# Patient Record
Sex: Female | Born: 1950 | Race: White | Hispanic: No | Marital: Married | Smoking: Never smoker
Health system: Southern US, Community
[De-identification: ages and names within clinical notes are randomized; demographics above are authoritative.]

## PROBLEM LIST (undated history)

## (undated) DIAGNOSIS — N189 Chronic kidney disease, unspecified: Secondary | ICD-10-CM

## (undated) DIAGNOSIS — I509 Heart failure, unspecified: Secondary | ICD-10-CM

## (undated) DIAGNOSIS — I2510 Atherosclerotic heart disease of native coronary artery without angina pectoris: Secondary | ICD-10-CM

## (undated) DIAGNOSIS — I10 Essential (primary) hypertension: Secondary | ICD-10-CM

## (undated) DIAGNOSIS — J449 Chronic obstructive pulmonary disease, unspecified: Secondary | ICD-10-CM

## (undated) HISTORY — PX: CARDIAC DEFIBRILLATOR PLACEMENT: SHX171

---

## 2009-05-25 HISTORY — PX: CORONARY ARTERY BYPASS GRAFT: SHX141

## 2010-02-02 ENCOUNTER — Inpatient Hospital Stay (HOSPITAL_COMMUNITY): Admission: EM | Admit: 2010-02-02 | Discharge: 2010-02-12 | Payer: Self-pay | Admitting: Cardiology

## 2010-02-02 ENCOUNTER — Encounter: Payer: Self-pay | Admitting: Emergency Medicine

## 2010-02-02 ENCOUNTER — Encounter: Payer: Self-pay | Admitting: Cardiology

## 2010-02-02 ENCOUNTER — Ambulatory Visit: Payer: Self-pay | Admitting: Diagnostic Radiology

## 2010-02-02 ENCOUNTER — Ambulatory Visit: Payer: Self-pay | Admitting: Cardiology

## 2010-02-03 ENCOUNTER — Encounter: Payer: Self-pay | Admitting: Cardiology

## 2010-02-03 ENCOUNTER — Encounter: Payer: Self-pay | Admitting: Cardiovascular Disease

## 2010-02-03 ENCOUNTER — Ambulatory Visit: Payer: Self-pay | Admitting: Cardiothoracic Surgery

## 2010-02-06 ENCOUNTER — Encounter: Payer: Self-pay | Admitting: Thoracic Surgery (Cardiothoracic Vascular Surgery)

## 2010-02-12 ENCOUNTER — Encounter: Payer: Self-pay | Admitting: Cardiology

## 2010-02-26 DIAGNOSIS — I2510 Atherosclerotic heart disease of native coronary artery without angina pectoris: Secondary | ICD-10-CM

## 2010-02-26 DIAGNOSIS — J438 Other emphysema: Secondary | ICD-10-CM

## 2010-02-26 DIAGNOSIS — E7800 Pure hypercholesterolemia, unspecified: Secondary | ICD-10-CM

## 2010-02-26 DIAGNOSIS — K219 Gastro-esophageal reflux disease without esophagitis: Secondary | ICD-10-CM | POA: Insufficient documentation

## 2010-02-26 DIAGNOSIS — I10 Essential (primary) hypertension: Secondary | ICD-10-CM

## 2010-02-26 DIAGNOSIS — F17200 Nicotine dependence, unspecified, uncomplicated: Secondary | ICD-10-CM

## 2010-02-26 DIAGNOSIS — J449 Chronic obstructive pulmonary disease, unspecified: Secondary | ICD-10-CM

## 2010-02-28 ENCOUNTER — Encounter: Payer: Self-pay | Admitting: Cardiology

## 2010-06-24 NOTE — Letter (Signed)
Summary: [place] [facility] Addendum  [facility] Addendum   Imported By: Roderic Ovens 03/14/2010 08:56:46  _____________________________________________________________________  External Attachment:    Type:   Image     Comment:   External Document

## 2010-06-24 NOTE — Consult Note (Signed)
Summary: [facility] Phase II Referral  [facility] Phase II Referral   Imported By: Roderic Ovens 04/04/2010 10:57:52  _____________________________________________________________________  External Attachment:    Type:   Image     Comment:   External Document

## 2010-06-24 NOTE — Consult Note (Signed)
Summary: [facility] Rehab Referral Form  [facility] Rehab Referral Form   Imported By: Roderic Ovens 03/12/2010 15:34:12  _____________________________________________________________________  External Attachment:    Type:   Image     Comment:   External Document

## 2010-06-24 NOTE — Letter (Signed)
Summary: [facility]  [facility]   Imported By: Marylou Mccoy 02/25/2010 08:26:20  _____________________________________________________________________  External Attachment:    Type:   Image     Comment:   External Document

## 2010-08-07 LAB — URINALYSIS, ROUTINE W REFLEX MICROSCOPIC
Bilirubin Urine: NEGATIVE
Glucose, UA: NEGATIVE mg/dL
Hgb urine dipstick: NEGATIVE
Ketones, ur: NEGATIVE mg/dL
Nitrite: NEGATIVE
Protein, ur: NEGATIVE mg/dL
Specific Gravity, Urine: 1.023 (ref 1.005–1.030)
Urobilinogen, UA: 0.2 mg/dL (ref 0.0–1.0)
pH: 5.5 (ref 5.0–8.0)

## 2010-08-07 LAB — BASIC METABOLIC PANEL
BUN: 10 mg/dL (ref 6–23)
BUN: 10 mg/dL (ref 6–23)
BUN: 12 mg/dL (ref 6–23)
BUN: 17 mg/dL (ref 6–23)
BUN: 29 mg/dL — ABNORMAL HIGH (ref 6–23)
BUN: 31 mg/dL — ABNORMAL HIGH (ref 6–23)
BUN: 35 mg/dL — ABNORMAL HIGH (ref 6–23)
BUN: 36 mg/dL — ABNORMAL HIGH (ref 6–23)
BUN: 15 mg/dL (ref 6–23)
CO2: 23 mEq/L (ref 19–32)
CO2: 26 mEq/L (ref 19–32)
CO2: 29 mEq/L (ref 19–32)
CO2: 30 mEq/L (ref 19–32)
CO2: 31 mEq/L (ref 19–32)
CO2: 32 mEq/L (ref 19–32)
CO2: 33 mEq/L — ABNORMAL HIGH (ref 19–32)
CO2: 26 mEq/L (ref 19–32)
Calcium: 8.6 mg/dL (ref 8.4–10.5)
Calcium: 8.7 mg/dL (ref 8.4–10.5)
Calcium: 8.7 mg/dL (ref 8.4–10.5)
Calcium: 8.7 mg/dL (ref 8.4–10.5)
Calcium: 8.8 mg/dL (ref 8.4–10.5)
Calcium: 8.9 mg/dL (ref 8.4–10.5)
Calcium: 9 mg/dL (ref 8.4–10.5)
Calcium: 8.7 mg/dL (ref 8.4–10.5)
Calcium: 9.2 mg/dL (ref 8.4–10.5)
Chloride: 108 mEq/L (ref 96–112)
Chloride: 110 mEq/L (ref 96–112)
Chloride: 96 mEq/L (ref 96–112)
Chloride: 96 mEq/L (ref 96–112)
Chloride: 97 mEq/L (ref 96–112)
Chloride: 97 mEq/L (ref 96–112)
Chloride: 98 mEq/L (ref 96–112)
Chloride: 100 mEq/L (ref 96–112)
Chloride: 102 mEq/L (ref 96–112)
Creatinine, Ser: 0.84 mg/dL (ref 0.4–1.2)
Creatinine, Ser: 0.88 mg/dL (ref 0.4–1.2)
Creatinine, Ser: 0.95 mg/dL (ref 0.4–1.2)
Creatinine, Ser: 1.25 mg/dL — ABNORMAL HIGH (ref 0.4–1.2)
Creatinine, Ser: 1.25 mg/dL — ABNORMAL HIGH (ref 0.4–1.2)
Creatinine, Ser: 1.28 mg/dL — ABNORMAL HIGH (ref 0.4–1.2)
Creatinine, Ser: 1.3 mg/dL — ABNORMAL HIGH (ref 0.4–1.2)
Creatinine, Ser: 0.94 mg/dL (ref 0.4–1.2)
GFR calc Af Amer: 51 mL/min — ABNORMAL LOW (ref >60)
GFR calc Af Amer: 53 mL/min — ABNORMAL LOW (ref >60)
GFR calc Af Amer: 53 mL/min — ABNORMAL LOW (ref >60)
GFR calc Af Amer: >60 mL/min (ref >60)
GFR calc Af Amer: >60 mL/min (ref >60)
GFR calc Af Amer: >60 mL/min (ref >60)
GFR calc Af Amer: >60 mL/min (ref >60)
GFR calc Af Amer: >60 mL/min (ref >60)
GFR calc Af Amer: >60 mL/min (ref >60)
GFR calc non Af Amer: 42 mL/min — ABNORMAL LOW (ref >60)
GFR calc non Af Amer: 43 mL/min — ABNORMAL LOW (ref >60)
GFR calc non Af Amer: 44 mL/min — ABNORMAL LOW (ref >60)
GFR calc non Af Amer: 44 mL/min — ABNORMAL LOW (ref >60)
GFR calc non Af Amer: 56 mL/min — ABNORMAL LOW (ref >60)
GFR calc non Af Amer: >60 mL/min (ref >60)
GFR calc non Af Amer: >60 mL/min (ref >60)
GFR calc non Af Amer: >60 mL/min (ref >60)
GFR calc non Af Amer: >60 mL/min (ref >60)
GFR calc non Af Amer: >60 mL/min (ref >60)
Glucose, Bld: 100 mg/dL — ABNORMAL HIGH (ref 70–99)
Glucose, Bld: 103 mg/dL — ABNORMAL HIGH (ref 70–99)
Glucose, Bld: 111 mg/dL — ABNORMAL HIGH (ref 70–99)
Glucose, Bld: 118 mg/dL — ABNORMAL HIGH (ref 70–99)
Glucose, Bld: 133 mg/dL — ABNORMAL HIGH (ref 70–99)
Glucose, Bld: 143 mg/dL — ABNORMAL HIGH (ref 70–99)
Glucose, Bld: 161 mg/dL — ABNORMAL HIGH (ref 70–99)
Glucose, Bld: 86 mg/dL (ref 70–99)
Glucose, Bld: 108 mg/dL — ABNORMAL HIGH (ref 70–99)
Glucose, Bld: 124 mg/dL — ABNORMAL HIGH (ref 70–99)
Potassium: 3.1 mEq/L — ABNORMAL LOW (ref 3.5–5.1)
Potassium: 3.3 mEq/L — ABNORMAL LOW (ref 3.5–5.1)
Potassium: 3.4 mEq/L — ABNORMAL LOW (ref 3.5–5.1)
Potassium: 3.5 mEq/L (ref 3.5–5.1)
Potassium: 3.8 mEq/L (ref 3.5–5.1)
Potassium: 4.3 mEq/L (ref 3.5–5.1)
Potassium: 4.3 mEq/L (ref 3.5–5.1)
Potassium: 3.3 mEq/L — ABNORMAL LOW (ref 3.5–5.1)
Potassium: 3.5 mEq/L (ref 3.5–5.1)
Potassium: 4.1 mEq/L (ref 3.5–5.1)
Sodium: 135 mEq/L (ref 135–145)
Sodium: 136 mEq/L (ref 135–145)
Sodium: 137 mEq/L (ref 135–145)
Sodium: 138 mEq/L (ref 135–145)
Sodium: 138 mEq/L (ref 135–145)
Sodium: 139 mEq/L (ref 135–145)
Sodium: 140 mEq/L (ref 135–145)
Sodium: 141 mEq/L (ref 135–145)
Sodium: 138 mEq/L (ref 135–145)
Sodium: 138 mEq/L (ref 135–145)

## 2010-08-07 LAB — POCT I-STAT 4, (NA,K, GLUC, HGB,HCT)
Glucose, Bld: 116 mg/dL — ABNORMAL HIGH (ref 70–99)
Glucose, Bld: 141 mg/dL — ABNORMAL HIGH (ref 70–99)
Glucose, Bld: 162 mg/dL — ABNORMAL HIGH (ref 70–99)
Glucose, Bld: 192 mg/dL — ABNORMAL HIGH (ref 70–99)
Glucose, Bld: 221 mg/dL — ABNORMAL HIGH (ref 70–99)
HCT: 26 % — ABNORMAL LOW (ref 36.0–46.0)
HCT: 28 % — ABNORMAL LOW (ref 36.0–46.0)
HCT: 28 % — ABNORMAL LOW (ref 36.0–46.0)
HCT: 29 % — ABNORMAL LOW (ref 36.0–46.0)
HCT: 32 % — ABNORMAL LOW (ref 36.0–46.0)
HCT: 39 % (ref 36.0–46.0)
Hemoglobin: 10.5 g/dL — ABNORMAL LOW (ref 12.0–15.0)
Hemoglobin: 10.9 g/dL — ABNORMAL LOW (ref 12.0–15.0)
Hemoglobin: 12.6 g/dL (ref 12.0–15.0)
Hemoglobin: 13.3 g/dL (ref 12.0–15.0)
Hemoglobin: 8.8 g/dL — ABNORMAL LOW (ref 12.0–15.0)
Hemoglobin: 9.5 g/dL — ABNORMAL LOW (ref 12.0–15.0)
Hemoglobin: 9.5 g/dL — ABNORMAL LOW (ref 12.0–15.0)
Hemoglobin: 9.9 g/dL — ABNORMAL LOW (ref 12.0–15.0)
Potassium: 3.2 meq/L — ABNORMAL LOW (ref 3.5–5.1)
Potassium: 3.3 mEq/L — ABNORMAL LOW (ref 3.5–5.1)
Potassium: 3.4 mEq/L — ABNORMAL LOW (ref 3.5–5.1)
Potassium: 3.4 meq/L — ABNORMAL LOW (ref 3.5–5.1)
Potassium: 3.6 meq/L (ref 3.5–5.1)
Sodium: 131 meq/L — ABNORMAL LOW (ref 135–145)
Sodium: 134 mEq/L — ABNORMAL LOW (ref 135–145)
Sodium: 138 mEq/L (ref 135–145)
Sodium: 138 meq/L (ref 135–145)
Sodium: 138 meq/L (ref 135–145)
Sodium: 139 mEq/L (ref 135–145)

## 2010-08-07 LAB — POCT I-STAT 3, ART BLOOD GAS (G3+)
Acid-Base Excess: 1 mmol/L (ref 0.0–2.0)
Acid-base deficit: 1 mmol/L (ref 0.0–2.0)
Acid-base deficit: 2 mmol/L (ref 0.0–2.0)
Acid-base deficit: 3 mmol/L — ABNORMAL HIGH (ref 0.0–2.0)
Bicarbonate: 23.9 meq/L (ref 20.0–24.0)
Bicarbonate: 24.1 mEq/L — ABNORMAL HIGH (ref 20.0–24.0)
Bicarbonate: 26.7 meq/L — ABNORMAL HIGH (ref 20.0–24.0)
O2 Saturation: 100 %
O2 Saturation: 100 %
O2 Saturation: 88 %
O2 Saturation: 90 %
O2 Saturation: 91 %
O2 Saturation: 99 %
Patient temperature: 98.6
TCO2: 25 mmol/L (ref 0–100)
TCO2: 26 mmol/L (ref 0–100)
TCO2: 28 mmol/L (ref 0–100)
pCO2 arterial: 45.2 mmHg — ABNORMAL HIGH (ref 35.0–45.0)
pCO2 arterial: 47.2 mmHg — ABNORMAL HIGH (ref 35.0–45.0)
pCO2 arterial: 47.5 mmHg — ABNORMAL HIGH (ref 35.0–45.0)
pCO2 arterial: 50.9 mmHg — ABNORMAL HIGH (ref 35.0–45.0)
pH, Arterial: 7.292 — ABNORMAL LOW (ref 7.350–7.400)
pH, Arterial: 7.31 — ABNORMAL LOW (ref 7.350–7.400)
pH, Arterial: 7.326 — ABNORMAL LOW (ref 7.350–7.400)
pH, Arterial: 7.38 (ref 7.350–7.400)
pO2, Arterial: 144 mmHg — ABNORMAL HIGH (ref 80.0–100.0)
pO2, Arterial: 336 mmHg — ABNORMAL HIGH (ref 80.0–100.0)
pO2, Arterial: 51 mmHg — ABNORMAL LOW (ref 80.0–100.0)
pO2, Arterial: 58 mmHg — ABNORMAL LOW (ref 80.0–100.0)

## 2010-08-07 LAB — PREPARE FRESH FROZEN PLASMA

## 2010-08-07 LAB — CREATININE, SERUM
Creatinine, Ser: 0.89 mg/dL (ref 0.4–1.2)
Creatinine, Ser: 0.92 mg/dL (ref 0.4–1.2)
GFR calc Af Amer: >60 mL/min (ref >60)
GFR calc Af Amer: >60 mL/min (ref >60)
GFR calc non Af Amer: >60 mL/min (ref >60)
GFR calc non Af Amer: >60 mL/min (ref >60)

## 2010-08-07 LAB — BLOOD GAS, ARTERIAL
Acid-Base Excess: 3.9 mmol/L — ABNORMAL HIGH (ref 0.0–2.0)
Bicarbonate: 28.1 mEq/L — ABNORMAL HIGH (ref 20.0–24.0)
Drawn by: 33176
FIO2: 0.21 %
O2 Saturation: 93.1 %
Patient temperature: 98.6
TCO2: 29.4 mmol/L (ref 0–100)
pCO2 arterial: 43.4 mmHg (ref 35.0–45.0)
pH, Arterial: 7.426 — ABNORMAL HIGH (ref 7.350–7.400)
pO2, Arterial: 61 mmHg — ABNORMAL LOW (ref 80.0–100.0)

## 2010-08-07 LAB — COMPREHENSIVE METABOLIC PANEL
ALT: 18 U/L (ref 0–35)
AST: 21 U/L (ref 0–37)
Albumin: 3.5 g/dL (ref 3.5–5.2)
Albumin: 3.7 g/dL (ref 3.5–5.2)
BUN: 16 mg/dL (ref 6–23)
CO2: 26 mEq/L (ref 19–32)
Calcium: 9.3 mg/dL (ref 8.4–10.5)
Chloride: 105 mEq/L (ref 96–112)
Creatinine, Ser: 0.96 mg/dL (ref 0.4–1.2)
Creatinine, Ser: 1 mg/dL (ref 0.4–1.2)
GFR calc Af Amer: >60 mL/min (ref >60)
Sodium: 141 mEq/L (ref 135–145)
Total Bilirubin: 0.8 mg/dL (ref 0.3–1.2)
Total Protein: 6.7 g/dL (ref 6.0–8.3)

## 2010-08-07 LAB — GLUCOSE, CAPILLARY
Glucose-Capillary: 100 mg/dL — ABNORMAL HIGH (ref 70–99)
Glucose-Capillary: 100 mg/dL — ABNORMAL HIGH (ref 70–99)
Glucose-Capillary: 100 mg/dL — ABNORMAL HIGH (ref 70–99)
Glucose-Capillary: 103 mg/dL — ABNORMAL HIGH (ref 70–99)
Glucose-Capillary: 105 mg/dL — ABNORMAL HIGH (ref 70–99)
Glucose-Capillary: 107 mg/dL — ABNORMAL HIGH (ref 70–99)
Glucose-Capillary: 110 mg/dL — ABNORMAL HIGH (ref 70–99)
Glucose-Capillary: 111 mg/dL — ABNORMAL HIGH (ref 70–99)
Glucose-Capillary: 113 mg/dL — ABNORMAL HIGH (ref 70–99)
Glucose-Capillary: 115 mg/dL — ABNORMAL HIGH (ref 70–99)
Glucose-Capillary: 117 mg/dL — ABNORMAL HIGH (ref 70–99)
Glucose-Capillary: 141 mg/dL — ABNORMAL HIGH (ref 70–99)
Glucose-Capillary: 149 mg/dL — ABNORMAL HIGH (ref 70–99)
Glucose-Capillary: 159 mg/dL — ABNORMAL HIGH (ref 70–99)
Glucose-Capillary: 160 mg/dL — ABNORMAL HIGH (ref 70–99)
Glucose-Capillary: 97 mg/dL (ref 70–99)

## 2010-08-07 LAB — CBC
HCT: 26.2 % — ABNORMAL LOW (ref 36.0–46.0)
HCT: 26.7 % — ABNORMAL LOW (ref 36.0–46.0)
HCT: 26.8 % — ABNORMAL LOW (ref 36.0–46.0)
HCT: 27.4 % — ABNORMAL LOW (ref 36.0–46.0)
HCT: 29.2 % — ABNORMAL LOW (ref 36.0–46.0)
HCT: 29.3 % — ABNORMAL LOW (ref 36.0–46.0)
HCT: 42 % (ref 36.0–46.0)
HCT: 41.9 % (ref 36.0–46.0)
Hemoglobin: 10 g/dL — ABNORMAL LOW (ref 12.0–15.0)
Hemoglobin: 14 g/dL (ref 12.0–15.0)
Hemoglobin: 8.5 g/dL — ABNORMAL LOW (ref 12.0–15.0)
Hemoglobin: 8.8 g/dL — ABNORMAL LOW (ref 12.0–15.0)
Hemoglobin: 8.9 g/dL — ABNORMAL LOW (ref 12.0–15.0)
Hemoglobin: 9.2 g/dL — ABNORMAL LOW (ref 12.0–15.0)
Hemoglobin: 9.3 g/dL — ABNORMAL LOW (ref 12.0–15.0)
Hemoglobin: 9.8 g/dL — ABNORMAL LOW (ref 12.0–15.0)
Hemoglobin: 13.5 g/dL (ref 12.0–15.0)
Hemoglobin: 14.3 g/dL (ref 12.0–15.0)
Hemoglobin: 14.4 g/dL (ref 12.0–15.0)
MCH: 31.9 pg (ref 26.0–34.0)
MCH: 32 pg (ref 26.0–34.0)
MCH: 32.3 pg (ref 26.0–34.0)
MCH: 32.5 pg (ref 26.0–34.0)
MCH: 32.6 pg (ref 26.0–34.0)
MCH: 32.7 pg (ref 26.0–34.0)
MCH: 32.8 pg (ref 26.0–34.0)
MCH: 32.9 pg (ref 26.0–34.0)
MCH: 32.4 pg (ref 26.0–34.0)
MCH: 32.9 pg (ref 26.0–34.0)
MCH: 32.9 pg (ref 26.0–34.0)
MCHC: 33 g/dL (ref 30.0–36.0)
MCHC: 33.2 g/dL (ref 30.0–36.0)
MCHC: 33.3 g/dL (ref 30.0–36.0)
MCHC: 33.6 g/dL (ref 30.0–36.0)
MCHC: 33.9 g/dL (ref 30.0–36.0)
MCHC: 34 g/dL (ref 30.0–36.0)
MCHC: 34.1 g/dL (ref 30.0–36.0)
MCHC: 34.3 g/dL (ref 30.0–36.0)
MCHC: 33.3 g/dL (ref 30.0–36.0)
MCHC: 33.6 g/dL (ref 30.0–36.0)
MCHC: 34.1 g/dL (ref 30.0–36.0)
MCV: 93.9 fL (ref 78.0–100.0)
MCV: 95 fL (ref 78.0–100.0)
MCV: 96.4 fL (ref 78.0–100.0)
MCV: 96.5 fL (ref 78.0–100.0)
MCV: 96.7 fL (ref 78.0–100.0)
MCV: 97 fL (ref 78.0–100.0)
MCV: 97.1 fL (ref 78.0–100.0)
MCV: 96.3 fL (ref 78.0–100.0)
MCV: 96.5 fL (ref 78.0–100.0)
Platelets: 133 10*3/uL — ABNORMAL LOW (ref 150–400)
Platelets: 140 10*3/uL — ABNORMAL LOW (ref 150–400)
Platelets: 145 10*3/uL — ABNORMAL LOW (ref 150–400)
Platelets: 147 10*3/uL — ABNORMAL LOW (ref 150–400)
Platelets: 147 10*3/uL — ABNORMAL LOW (ref 150–400)
Platelets: 154 10*3/uL (ref 150–400)
Platelets: 174 10*3/uL (ref 150–400)
Platelets: 221 10*3/uL (ref 150–400)
Platelets: 143 10*3/uL — ABNORMAL LOW (ref 150–400)
Platelets: 144 10*3/uL — ABNORMAL LOW (ref 150–400)
Platelets: 154 10*3/uL (ref 150–400)
RBC: 2.75 MIL/uL — ABNORMAL LOW (ref 3.87–5.11)
RBC: 2.79 MIL/uL — ABNORMAL LOW (ref 3.87–5.11)
RBC: 2.82 MIL/uL — ABNORMAL LOW (ref 3.87–5.11)
RBC: 2.84 MIL/uL — ABNORMAL LOW (ref 3.87–5.11)
RBC: 3.02 MIL/uL — ABNORMAL LOW (ref 3.87–5.11)
RBC: 3.04 MIL/uL — ABNORMAL LOW (ref 3.87–5.11)
RBC: 4.33 MIL/uL (ref 3.87–5.11)
RBC: 4.34 MIL/uL (ref 3.87–5.11)
RBC: 4.38 MIL/uL (ref 3.87–5.11)
RDW: 12.6 % (ref 11.5–15.5)
RDW: 12.6 % (ref 11.5–15.5)
RDW: 12.7 % (ref 11.5–15.5)
RDW: 12.8 % (ref 11.5–15.5)
RDW: 13.1 % (ref 11.5–15.5)
RDW: 13.1 % (ref 11.5–15.5)
RDW: 13.3 % (ref 11.5–15.5)
RDW: 13.4 % (ref 11.5–15.5)
RDW: 12.6 % (ref 11.5–15.5)
RDW: 12.6 % (ref 11.5–15.5)
WBC: 10.7 10*3/uL — ABNORMAL HIGH (ref 4.0–10.5)
WBC: 11.3 10*3/uL — ABNORMAL HIGH (ref 4.0–10.5)
WBC: 11.9 10*3/uL — ABNORMAL HIGH (ref 4.0–10.5)
WBC: 13.4 10*3/uL — ABNORMAL HIGH (ref 4.0–10.5)
WBC: 13.4 10*3/uL — ABNORMAL HIGH (ref 4.0–10.5)
WBC: 17.7 10*3/uL — ABNORMAL HIGH (ref 4.0–10.5)
WBC: 8.2 10*3/uL (ref 4.0–10.5)
WBC: 7.9 10*3/uL (ref 4.0–10.5)
WBC: 8.4 10*3/uL (ref 4.0–10.5)

## 2010-08-07 LAB — PREPARE PLATELETS

## 2010-08-07 LAB — POCT I-STAT, CHEM 8
Calcium, Ion: 1.25 mmol/L (ref 1.12–1.32)
Chloride: 108 mEq/L (ref 96–112)
Glucose, Bld: 139 mg/dL — ABNORMAL HIGH (ref 70–99)
Hemoglobin: 10.2 g/dL — ABNORMAL LOW (ref 12.0–15.0)
Potassium: 4 mEq/L (ref 3.5–5.1)
Sodium: 139 mEq/L (ref 135–145)
TCO2: 22 mmol/L (ref 0–100)

## 2010-08-07 LAB — CROSSMATCH
ABO/RH(D): A POS
Antibody Screen: NEGATIVE

## 2010-08-07 LAB — MAGNESIUM
Magnesium: 2.2 mg/dL (ref 1.5–2.5)
Magnesium: 2.4 mg/dL (ref 1.5–2.5)
Magnesium: 2.8 mg/dL — ABNORMAL HIGH (ref 1.5–2.5)
Magnesium: 1.8 mg/dL (ref 1.5–2.5)
Magnesium: 1.8 mg/dL (ref 1.5–2.5)
Magnesium: 1.9 mg/dL (ref 1.5–2.5)

## 2010-08-07 LAB — URINE CULTURE
Colony Count: >=100000
Culture  Setup Time: 201109130817
Special Requests: NEGATIVE

## 2010-08-07 LAB — DIFFERENTIAL
Eosinophils Absolute: 0.2 10*3/uL (ref 0.0–0.7)
Eosinophils Relative: 2 % (ref 0–5)
Lymphocytes Relative: 33 % (ref 12–46)
Lymphs Abs: 3.1 10*3/uL (ref 0.7–4.0)
Monocytes Absolute: 1 10*3/uL (ref 0.1–1.0)

## 2010-08-07 LAB — PROTIME-INR
INR: 1.28 (ref 0.00–1.49)
INR: 0.94 (ref 0.00–1.49)
Prothrombin Time: 16.2 seconds — ABNORMAL HIGH (ref 11.6–15.2)
Prothrombin Time: 12.8 seconds (ref 11.6–15.2)

## 2010-08-07 LAB — LIPID PANEL
Triglycerides: 184 mg/dL — ABNORMAL HIGH (ref <150)
VLDL: 37 mg/dL (ref 0–40)

## 2010-08-07 LAB — HEMOGLOBIN AND HEMATOCRIT, BLOOD: Hemoglobin: 9 g/dL — ABNORMAL LOW (ref 12.0–15.0)

## 2010-08-07 LAB — CARDIAC PANEL(CRET KIN+CKTOT+MB+TROPI)
CK, MB: 172 ng/mL (ref 0.3–4.0)
CK, MB: 199.3 ng/mL (ref 0.3–4.0)
CK, MB: 276.3 ng/mL (ref 0.3–4.0)
Relative Index: 16.8 — ABNORMAL HIGH (ref 0.0–2.5)
Relative Index: 20.7 — ABNORMAL HIGH (ref 0.0–2.5)
Total CK: 1020 U/L — ABNORMAL HIGH (ref 7–177)
Total CK: 963 U/L — ABNORMAL HIGH (ref 7–177)
Troponin I: 12.8 ng/mL (ref 0.00–0.06)
Troponin I: 25.87 ng/mL (ref 0.00–0.06)

## 2010-08-07 LAB — POCT CARDIAC MARKERS: Troponin i, poc: <0.05 ng/mL (ref 0.00–0.09)

## 2010-08-07 LAB — PLATELET COUNT: Platelets: 80 10*3/uL — ABNORMAL LOW (ref 150–400)

## 2010-08-07 LAB — HEPARIN LEVEL (UNFRACTIONATED)
Heparin Unfractionated: 0.32 IU/mL (ref 0.30–0.70)
Heparin Unfractionated: 0.33 IU/mL (ref 0.30–0.70)
Heparin Unfractionated: 0.52 IU/mL (ref 0.30–0.70)
Heparin Unfractionated: 0.79 IU/mL — ABNORMAL HIGH (ref 0.30–0.70)

## 2010-08-07 LAB — POCT I-STAT GLUCOSE
Glucose, Bld: 131 mg/dL — ABNORMAL HIGH (ref 70–99)
Operator id: 286331

## 2010-08-07 LAB — HEMOGLOBIN A1C
Hgb A1c MFr Bld: 5.5 % (ref <5.7)
Mean Plasma Glucose: 111 mg/dL (ref <117)

## 2010-08-07 LAB — BRAIN NATRIURETIC PEPTIDE: Pro B Natriuretic peptide (BNP): 158 pg/mL — ABNORMAL HIGH (ref 0.0–100.0)

## 2010-08-07 LAB — APTT
aPTT: 36 seconds (ref 24–37)
aPTT: 139 seconds — ABNORMAL HIGH (ref 24–37)

## 2010-08-07 LAB — MRSA PCR SCREENING: MRSA by PCR: NEGATIVE

## 2010-08-07 LAB — PLATELET INHIBITION P2Y12: Platelet Function  P2Y12: 249 [PRU] (ref 194–418)

## 2011-12-12 IMAGING — CR DG CHEST 1V PORT
1 series · 1 of 1 positions shown · non-contrast
Comparison: None

CLINICAL DATA: Chest pain.

PORTABLE CHEST - 1 VIEW

[view not recorded]
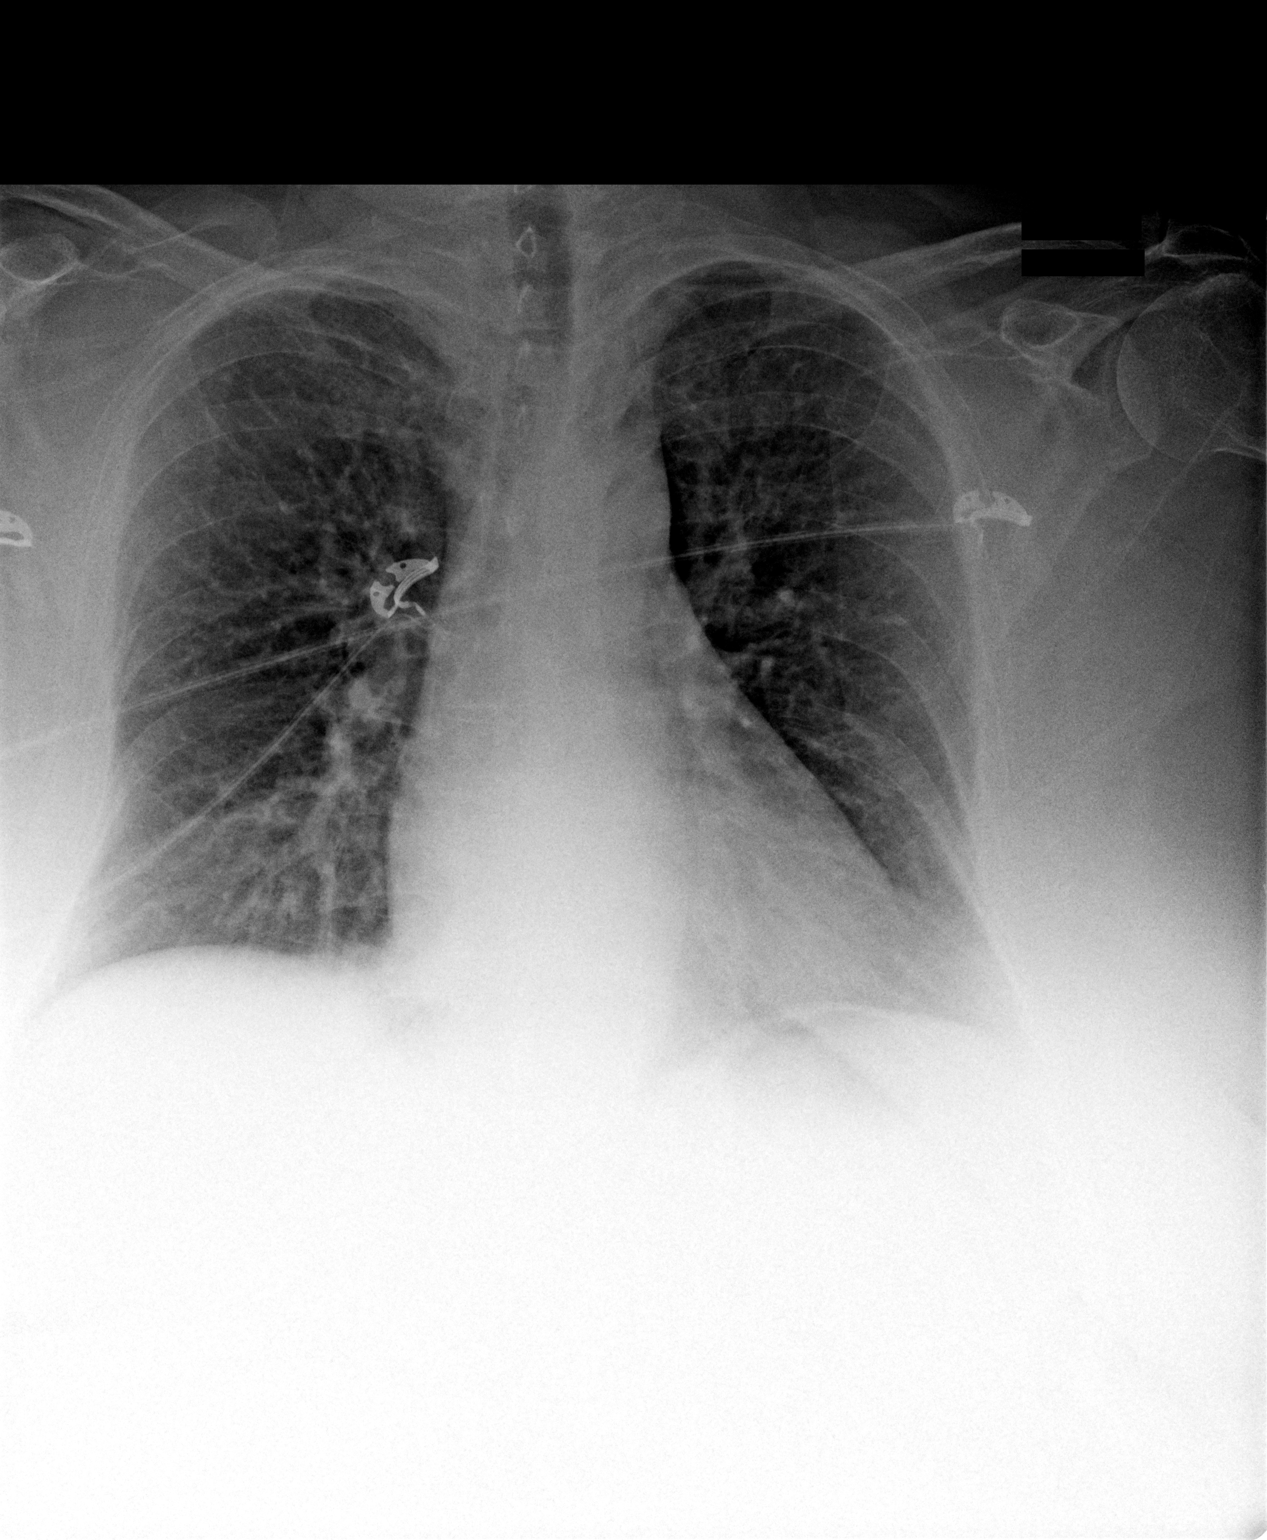

[1 of 1 positions shown; findings below may reference images not displayed]

FINDINGS: Upper limits normal heart size identified.
Diffuse peribronchial thickening is noted.
There is no evidence of focal airspace disease, pulmonary edema,
pleural effusion, or pneumothorax.
No acute bony abnormalities are identified.
IMPRESSION: Diffuse peribronchial thickening which may be chronic.

Upper limits normal heart size.

## 2011-12-15 IMAGING — CR DG CHEST 1V PORT
1 series · 1 of 1 positions shown · non-contrast
Comparison: Portable exam 8387 hours compared to 02/04/2010

CLINICAL DATA: Chest pain

PORTABLE CHEST - 1 VIEW

[AP]
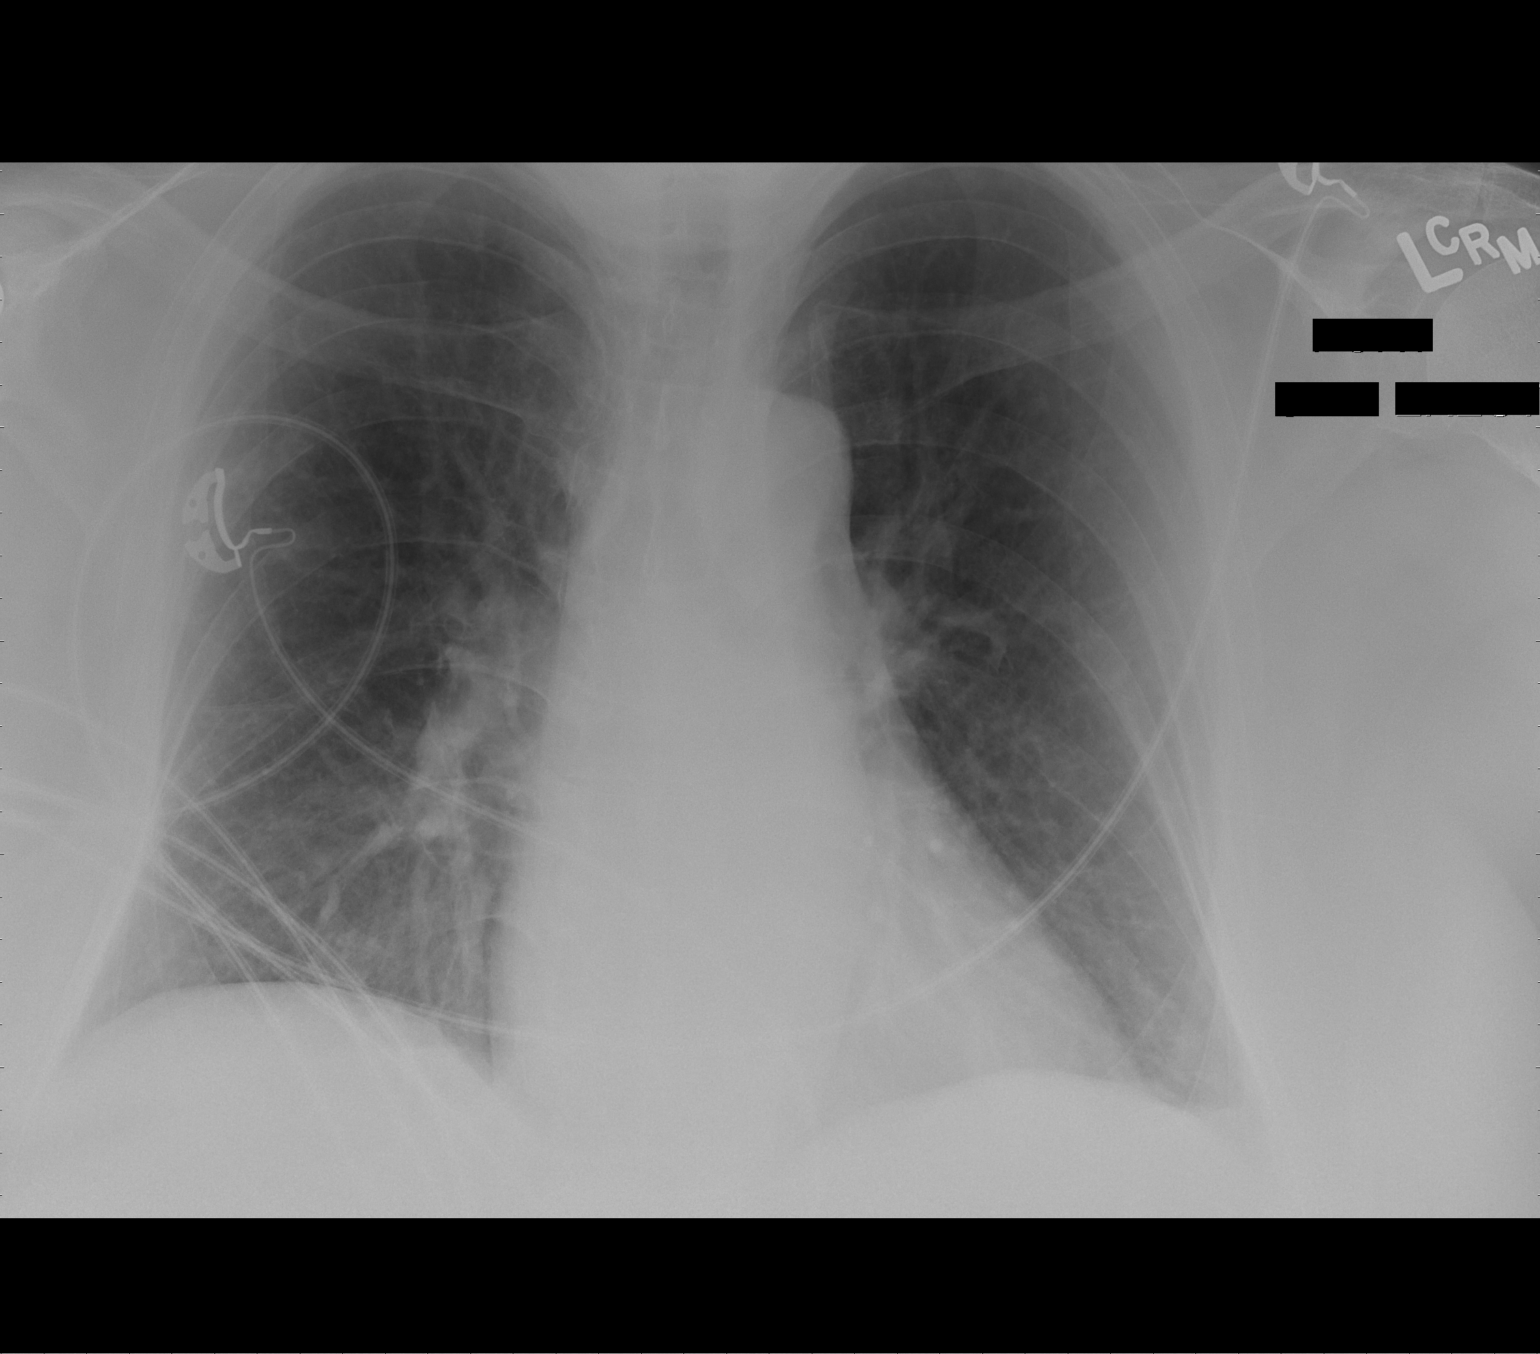

[1 of 1 positions shown; findings below may reference images not displayed]

FINDINGS: Borderline enlargement of cardiac silhouette.
Mediastinal contours and pulmonary vascularity normal.
Atelectasis versus less likely developing infiltrate medial right
lung base.
Remaining lungs clear.
Underlying emphysematous changes.
No gross effusion or pneumothorax.
Bony demineralization.
IMPRESSION: Emphysematous changes with atelectasis versus less likely
infiltrate at medial right lung base.

## 2016-04-27 ENCOUNTER — Telehealth: Payer: Self-pay

## 2016-04-27 NOTE — Telephone Encounter (Signed)
SENT NOTES TO SCHEDULING 

## 2016-05-01 ENCOUNTER — Telehealth: Payer: Self-pay | Admitting: Cardiovascular Disease

## 2016-05-01 NOTE — Telephone Encounter (Signed)
Received [facility] for appointment on 05/27/16 with Dr Allyson SabalBerry.  [facility] for Dr Hazle CocaBerry's schedule on 05/27/16. lp

## 2016-05-27 ENCOUNTER — Ambulatory Visit: Payer: Self-pay | Admitting: Cardiovascular Disease

## 2019-05-22 ENCOUNTER — Inpatient Hospital Stay (HOSPITAL_COMMUNITY)
Admission: EM | Admit: 2019-05-22 | Discharge: 2019-05-29 | DRG: 872 | Disposition: A | Discharge status: Skilled Nursing Facility | Payer: Medicare Other | Attending: Osteopathic Medicine | Admitting: Osteopathic Medicine

## 2019-05-22 ENCOUNTER — Other Ambulatory Visit: Payer: Self-pay

## 2019-05-22 ENCOUNTER — Emergency Department (HOSPITAL_COMMUNITY): Payer: Medicare Other

## 2019-05-22 ENCOUNTER — Encounter (HOSPITAL_COMMUNITY): Payer: Self-pay | Admitting: Internal Medicine

## 2019-05-22 DIAGNOSIS — Z95810 Presence of automatic (implantable) cardiac defibrillator: Secondary | ICD-10-CM | POA: Diagnosis not present

## 2019-05-22 DIAGNOSIS — M109 Gout, unspecified: Secondary | ICD-10-CM | POA: Diagnosis present

## 2019-05-22 DIAGNOSIS — Z20822 Contact with and (suspected) exposure to covid-19: Secondary | ICD-10-CM | POA: Diagnosis present

## 2019-05-22 DIAGNOSIS — A4181 Sepsis due to Enterococcus: Secondary | ICD-10-CM | POA: Diagnosis not present

## 2019-05-22 DIAGNOSIS — E7800 Pure hypercholesterolemia, unspecified: Secondary | ICD-10-CM | POA: Diagnosis present

## 2019-05-22 DIAGNOSIS — N189 Chronic kidney disease, unspecified: Secondary | ICD-10-CM | POA: Diagnosis present

## 2019-05-22 DIAGNOSIS — I429 Cardiomyopathy, unspecified: Secondary | ICD-10-CM | POA: Diagnosis present

## 2019-05-22 DIAGNOSIS — L89316 Pressure-induced deep tissue damage of right buttock: Secondary | ICD-10-CM | POA: Diagnosis present

## 2019-05-22 DIAGNOSIS — N390 Urinary tract infection, site not specified: Secondary | ICD-10-CM | POA: Diagnosis not present

## 2019-05-22 DIAGNOSIS — R6510 Systemic inflammatory response syndrome (SIRS) of non-infectious origin without acute organ dysfunction: Secondary | ICD-10-CM | POA: Diagnosis present

## 2019-05-22 DIAGNOSIS — I130 Hypertensive heart and chronic kidney disease with heart failure and stage 1 through stage 4 chronic kidney disease, or unspecified chronic kidney disease: Secondary | ICD-10-CM | POA: Diagnosis not present

## 2019-05-22 DIAGNOSIS — J449 Chronic obstructive pulmonary disease, unspecified: Secondary | ICD-10-CM | POA: Diagnosis not present

## 2019-05-22 DIAGNOSIS — Z6841 Body Mass Index (BMI) 40.0 and over, adult: Secondary | ICD-10-CM

## 2019-05-22 DIAGNOSIS — L03116 Cellulitis of left lower limb: Secondary | ICD-10-CM | POA: Diagnosis present

## 2019-05-22 DIAGNOSIS — Z87440 Personal history of urinary (tract) infections: Secondary | ICD-10-CM | POA: Diagnosis not present

## 2019-05-22 DIAGNOSIS — E785 Hyperlipidemia, unspecified: Secondary | ICD-10-CM | POA: Diagnosis present

## 2019-05-22 DIAGNOSIS — I2510 Atherosclerotic heart disease of native coronary artery without angina pectoris: Secondary | ICD-10-CM | POA: Diagnosis present

## 2019-05-22 DIAGNOSIS — R531 Weakness: Secondary | ICD-10-CM

## 2019-05-22 DIAGNOSIS — I480 Paroxysmal atrial fibrillation: Secondary | ICD-10-CM | POA: Diagnosis present

## 2019-05-22 DIAGNOSIS — L0390 Cellulitis, unspecified: Secondary | ICD-10-CM

## 2019-05-22 DIAGNOSIS — E860 Dehydration: Secondary | ICD-10-CM | POA: Diagnosis present

## 2019-05-22 DIAGNOSIS — L89312 Pressure ulcer of right buttock, stage 2: Secondary | ICD-10-CM | POA: Diagnosis present

## 2019-05-22 DIAGNOSIS — Z951 Presence of aortocoronary bypass graft: Secondary | ICD-10-CM | POA: Diagnosis not present

## 2019-05-22 DIAGNOSIS — I5022 Chronic systolic (congestive) heart failure: Secondary | ICD-10-CM | POA: Diagnosis not present

## 2019-05-22 DIAGNOSIS — Z66 Do not resuscitate: Secondary | ICD-10-CM | POA: Diagnosis not present

## 2019-05-22 DIAGNOSIS — L8990 Pressure ulcer of unspecified site, unspecified stage: Secondary | ICD-10-CM | POA: Insufficient documentation

## 2019-05-22 DIAGNOSIS — I10 Essential (primary) hypertension: Secondary | ICD-10-CM | POA: Diagnosis present

## 2019-05-22 HISTORY — DX: Chronic kidney disease, unspecified: N18.9

## 2019-05-22 HISTORY — DX: Heart failure, unspecified: I50.9

## 2019-05-22 HISTORY — DX: Atherosclerotic heart disease of native coronary artery without angina pectoris: I25.10

## 2019-05-22 HISTORY — DX: Essential (primary) hypertension: I10

## 2019-05-22 HISTORY — DX: Chronic obstructive pulmonary disease, unspecified: J44.9

## 2019-05-22 LAB — CBC WITH DIFFERENTIAL/PLATELET
Abs Immature Granulocytes: 0.11 10*3/uL — ABNORMAL HIGH (ref 0.00–0.07)
Basophils Absolute: 0 10*3/uL (ref 0.0–0.1)
Basophils Relative: 0 %
Eosinophils Absolute: 0 10*3/uL (ref 0.0–0.5)
Eosinophils Relative: 0 %
HCT: 41.1 % (ref 36.0–46.0)
Hemoglobin: 13.5 g/dL (ref 12.0–15.0)
Immature Granulocytes: 1 %
Lymphocytes Relative: 14 %
Lymphs Abs: 1.7 10*3/uL (ref 0.7–4.0)
MCH: 33.8 pg (ref 26.0–34.0)
MCHC: 32.8 g/dL (ref 30.0–36.0)
MCV: 102.8 fL — ABNORMAL HIGH (ref 80.0–100.0)
Monocytes Absolute: 1.6 10*3/uL — ABNORMAL HIGH (ref 0.1–1.0)
Monocytes Relative: 13 %
Neutro Abs: 8.9 10*3/uL — ABNORMAL HIGH (ref 1.7–7.7)
Neutrophils Relative %: 72 %
Platelets: 333 10*3/uL (ref 150–400)
RBC: 4 MIL/uL (ref 3.87–5.11)
RDW: 13.4 % (ref 11.5–15.5)
WBC: 12.3 10*3/uL — ABNORMAL HIGH (ref 4.0–10.5)
nRBC: 0 % (ref 0.0–0.2)

## 2019-05-22 LAB — URINALYSIS, ROUTINE W REFLEX MICROSCOPIC
Bilirubin Urine: NEGATIVE
Glucose, UA: NEGATIVE mg/dL
Hgb urine dipstick: NEGATIVE
Ketones, ur: NEGATIVE mg/dL
Leukocytes,Ua: NEGATIVE
Nitrite: NEGATIVE
Protein, ur: NEGATIVE mg/dL
Specific Gravity, Urine: 1.023 (ref 1.005–1.030)
pH: 5 (ref 5.0–8.0)

## 2019-05-22 LAB — COMPREHENSIVE METABOLIC PANEL
ALT: 16 U/L (ref 0–44)
AST: 24 U/L (ref 15–41)
Albumin: 2.3 g/dL — ABNORMAL LOW (ref 3.5–5.0)
Alkaline Phosphatase: 78 U/L (ref 38–126)
Anion gap: 11 (ref 5–15)
BUN: 26 mg/dL — ABNORMAL HIGH (ref 8–23)
CO2: 30 mmol/L (ref 22–32)
Calcium: 9.3 mg/dL (ref 8.9–10.3)
Chloride: 95 mmol/L — ABNORMAL LOW (ref 98–111)
Creatinine, Ser: 0.8 mg/dL (ref 0.44–1.00)
GFR calc Af Amer: >60 mL/min (ref >60)
GFR calc non Af Amer: >60 mL/min (ref >60)
Glucose, Bld: 126 mg/dL — ABNORMAL HIGH (ref 70–99)
Potassium: 4.4 mmol/L (ref 3.5–5.1)
Sodium: 136 mmol/L (ref 135–145)
Total Bilirubin: 1 mg/dL (ref 0.3–1.2)
Total Protein: 6.4 g/dL — ABNORMAL LOW (ref 6.5–8.1)

## 2019-05-22 LAB — CK: Total CK: 26 U/L — ABNORMAL LOW (ref 38–234)

## 2019-05-22 LAB — LACTIC ACID, PLASMA: Lactic Acid, Venous: 2.6 mmol/L (ref 0.5–1.9)

## 2019-05-22 MED ORDER — ROSUVASTATIN CALCIUM 20 MG PO TABS
40.0000 mg | ORAL_TABLET | Freq: Every day | ORAL | Status: DC
  Administered 2019-05-23 – 2019-05-29 (×7): 40 mg via ORAL
  Filled 2019-05-22 (×7): qty 2

## 2019-05-22 MED ORDER — SODIUM CHLORIDE 0.9 % IV SOLN
2.0000 g | INTRAVENOUS | Status: DC
  Administered 2019-05-22 – 2019-05-23 (×2): 2 g via INTRAVENOUS
  Filled 2019-05-22 (×3): qty 20

## 2019-05-22 MED ORDER — ACETAMINOPHEN 650 MG RE SUPP: 650.0000 mg | Freq: Four times a day (QID) | RECTAL | Status: DC | PRN

## 2019-05-22 MED ORDER — SODIUM CHLORIDE 0.9 % IV BOLUS (SEPSIS)
500.0000 mL | Freq: Once | INTRAVENOUS | Status: AC
  Administered 2019-05-22: 17:00:00 500 mL via INTRAVENOUS

## 2019-05-22 MED ORDER — ALLOPURINOL 300 MG PO TABS
300.0000 mg | ORAL_TABLET | Freq: Every day | ORAL | Status: DC
  Administered 2019-05-23 – 2019-05-29 (×7): 300 mg via ORAL
  Filled 2019-05-22 (×8): qty 1

## 2019-05-22 MED ORDER — ACETAMINOPHEN 325 MG PO TABS
650.0000 mg | ORAL_TABLET | Freq: Four times a day (QID) | ORAL | Status: DC | PRN
  Administered 2019-05-23: 650 mg via ORAL
  Filled 2019-05-22: qty 2

## 2019-05-22 MED ORDER — SODIUM CHLORIDE 0.9 % IV SOLN: Freq: Once | INTRAVENOUS | Status: AC

## 2019-05-22 MED ORDER — PANTOPRAZOLE SODIUM 40 MG PO TBEC
40.0000 mg | DELAYED_RELEASE_TABLET | Freq: Every day | ORAL | Status: DC
  Administered 2019-05-23 – 2019-05-29 (×7): 40 mg via ORAL
  Filled 2019-05-22 (×7): qty 1

## 2019-05-22 MED ORDER — ISOSORBIDE MONONITRATE ER 30 MG PO TB24
30.0000 mg | ORAL_TABLET | Freq: Every day | ORAL | Status: DC
  Administered 2019-05-23: 30 mg via ORAL
  Filled 2019-05-22: qty 1

## 2019-05-22 MED ORDER — VENLAFAXINE HCL ER 75 MG PO CP24
150.0000 mg | ORAL_CAPSULE | Freq: Every day | ORAL | Status: DC
  Administered 2019-05-23 – 2019-05-29 (×7): 150 mg via ORAL
  Filled 2019-05-22 (×2): qty 2
  Filled 2019-05-22: qty 1
  Filled 2019-05-22 (×5): qty 2

## 2019-05-22 MED ORDER — DABIGATRAN ETEXILATE MESYLATE 150 MG PO CAPS
150.0000 mg | ORAL_CAPSULE | Freq: Two times a day (BID) | ORAL | Status: DC
  Administered 2019-05-22 – 2019-05-29 (×14): 150 mg via ORAL
  Filled 2019-05-22 (×15): qty 1

## 2019-05-22 MED ORDER — GABAPENTIN 600 MG PO TABS
600.0000 mg | ORAL_TABLET | Freq: Three times a day (TID) | ORAL | Status: DC
  Administered 2019-05-23 – 2019-05-29 (×19): 600 mg via ORAL
  Filled 2019-05-22 (×19): qty 1

## 2019-05-22 MED ORDER — ONDANSETRON HCL 4 MG/2ML IJ SOLN: 4.0000 mg | Freq: Four times a day (QID) | INTRAMUSCULAR | Status: DC | PRN

## 2019-05-22 MED ORDER — ONDANSETRON HCL 4 MG PO TABS: 4.0000 mg | ORAL_TABLET | Freq: Four times a day (QID) | ORAL | Status: DC | PRN

## 2019-05-22 NOTE — H&P (Signed)
History and Physical    Karen LambertSheila Stanley RUE:[id number]:[id number] DOB: 02/02/1951 DOA: 05/22/2019  PCP: System, Pcp Not In  Patient coming from: Home.  Chief Complaint: Weakness.  HPI: Karen Stanley is a 68 y.o. female with history of CAD status post CABG, systolic heart failure with last EF measured was 30 to 35% in November 2020 last month admitted at [facility] for CHF exacerbation eventually discharged home has been feeling weak and unable to get up from her recliner for almost 9 days now.  Patient's medication and food has been managed by her daughter.  Given that patient does have unable to ambulate EMS was called per EMS on arrival as per the report found patient to be hypotensive.  Was given fluid bolus and brought to the ER.  ED Course: As ordered a 1 L bolus was given for which blood pressure improved.  Lactate was elevated at 2.6 which improved to 2.2.  WBC count was 12.3.  Chest x-ray unremarkable Covid test was negative.  Given the hypotension and elevated lactate with WBC count concern for possible sepsis.  UA is unremarkable but on exam patient has cellulitis of the thigh area mostly in the medial aspect and some wounds on the buttock area.  Patient admitted for further management of SIRS likely from cellulitis and may need rehab.  Review of Systems: As per HPI, rest all negative.   Past Medical History:  Diagnosis Date  . CHF (congestive heart failure) (HCC)   . Chronic kidney disease   . COPD (chronic obstructive pulmonary disease) (HCC)   . Coronary artery disease   . Hypertension     Past Surgical History:  Procedure Laterality Date  . CARDIAC DEFIBRILLATOR PLACEMENT       reports that she has never smoked. She has never used smokeless tobacco. No history on file for alcohol and drug.  No Known Allergies  Family History  Family history unknown: Yes    Prior to Admission medications   Medication Sig Start Date End Date Taking? Authorizing Provider  allopurinol  (ZYLOPRIM) 300 MG tablet Take 300 mg by mouth daily.   Yes [provider]  cefdinir (OMNICEF) 300 MG capsule Take 300 mg by mouth 2 (two) times daily.   Yes [provider]  dabigatran (PRADAXA) 150 MG CAPS capsule Take 150 mg by mouth 2 (two) times daily.   Yes [provider]  furosemide (LASIX) 40 MG tablet Take 40 mg by mouth.   Yes [provider]  gabapentin (NEURONTIN) 600 MG tablet Take 600 mg by mouth 3 (three) times daily.   Yes [provider]  isosorbide mononitrate (IMDUR) 30 MG 24 hr tablet Take 30 mg by mouth daily.   Yes [provider]  pantoprazole (PROTONIX) 40 MG tablet Take 40 mg by mouth daily.   Yes [provider]  rosuvastatin (CRESTOR) 40 MG tablet Take 40 mg by mouth daily.   Yes [provider]  venlafaxine XR (EFFEXOR-XR) 150 MG 24 hr capsule Take 150 mg by mouth daily with breakfast.   Yes [provider]    Physical Exam: Constitutional: Moderately built and nourished. Vitals:   05/22/19 1815 05/22/19 1830 05/22/19 1845 05/22/19 2100  BP: 127/74 138/79 (!) 142/78 123/90  Pulse: 98 99 99 98  Resp: (!) 24 (!) 24 (!) 24 (!) 23  Temp:      TempSrc:      SpO2: 99% 100% 99% 98%  Weight:  Height:       Eyes: Anicteric no pallor. ENMT: No discharge from the ears eyes nose or mouth. Neck: No mass felt.  No neck rigidity. Respiratory: No rhonchi or crepitations. Cardiovascular: S1-S2 heard. Abdomen: Soft nontender bowel sounds present. Musculoskeletal: Mild edema of the both lower extremity. Skin: Erythema of the medial aspect of the left thigh extending up to the buttocks. Neurologic: Alert awake oriented time place and person.  Moves all extremities. Psychiatric: Appears normal per normal affect.   Labs on Admission: I have personally reviewed following labs and imaging studies  CBC: Recent Labs  Lab 05/22/19 1643  WBC 12.3*  NEUTROABS 8.9*  HGB 13.5  HCT 41.1   MCV 102.8*  PLT 333   Basic Metabolic Panel: Recent Labs  Lab 05/22/19 1643  NA 136  K 4.4  CL 95*  CO2 30  GLUCOSE 126*  BUN 26*  CREATININE 0.80  CALCIUM 9.3   GFR: Estimated Creatinine Clearance: 87.8 mL/min (by C-G formula based on SCr of 0.8 mg/dL). Liver Function Tests: Recent Labs  Lab 05/22/19 1643  AST 24  ALT 16  ALKPHOS 78  BILITOT 1.0  PROT 6.4*  ALBUMIN 2.3*   No results for input(s): LIPASE, AMYLASE in the last 168 hours. No results for input(s): AMMONIA in the last 168 hours. Coagulation Profile: No results for input(s): INR, PROTIME in the last 168 hours. Cardiac Enzymes: Recent Labs  Lab 05/22/19 1643  CKTOTAL 26*   BNP (last 3 results) No results for input(s): PROBNP in the last 8760 hours. HbA1C: No results for input(s): HGBA1C in the last 72 hours. CBG: No results for input(s): GLUCAP in the last 168 hours. Lipid Profile: No results for input(s): CHOL, HDL, LDLCALC, TRIG, CHOLHDL, LDLDIRECT in the last 72 hours. Thyroid Function Tests: No results for input(s): TSH, T4TOTAL, FREET4, T3FREE, THYROIDAB in the last 72 hours. Anemia Panel: No results for input(s): VITAMINB12, FOLATE, FERRITIN, TIBC, IRON, RETICCTPCT in the last 72 hours. Urine analysis:    Component Value Date/Time   COLORURINE YELLOW 05/22/2019 1652   APPEARANCEUR CLEAR 05/22/2019 1652   LABSPEC 1.023 05/22/2019 1652   PHURINE 5.0 05/22/2019 1652   GLUCOSEU NEGATIVE 05/22/2019 1652   HGBUR NEGATIVE 05/22/2019 1652   BILIRUBINUR NEGATIVE 05/22/2019 1652   KETONESUR NEGATIVE 05/22/2019 1652   PROTEINUR NEGATIVE 05/22/2019 1652   UROBILINOGEN 0.2 02/03/2010 2339   NITRITE NEGATIVE 05/22/2019 1652   LEUKOCYTESUR NEGATIVE 05/22/2019 1652   Sepsis Labs: @LABRCNTIP (procalcitonin:4,lacticidven:4) )No results found for this or any previous visit (from the past 240 hour(s)).   Radiological Exams on Admission: DG Chest Port 1 View  Result Date: 05/22/2019 CLINICAL DATA:   Weakness. EXAM: PORTABLE CHEST 1 VIEW COMPARISON:  Chest radiograph 05/09/2019 FINDINGS: Redemonstrated left chest AICD device. Mild cardiomegaly is unchanged. Prior CABG. No focal consolidation within the lungs. No evidence of pleural effusion or pneumothorax. No acute bony abnormality. IMPRESSION: No evidence of acute cardiopulmonary abnormality. Stable, mild cardiomegaly. Electronically Signed   By: 05/11/2019 DO   On: 05/22/2019 17:12    EKG: Independently reviewed.  A. fib LVH.  Broad QRS.  Assessment/Plan Principal Problem:   SIRS (systemic inflammatory response syndrome) (HCC) Active Problems:   Essential hypertension   Coronary atherosclerosis   PAF (paroxysmal atrial fibrillation) (HCC)   Cellulitis   Cardiomyopathy (HCC) 30-35 % EF 11/20    1. SIRS likely from cellulitis of the thigh area mostly in the left thigh extending to the buttock.  Patient is on  vancomycin ceftriaxone follow cultures.  Follow lactate procalcitonin. 2. Weakness gently from deconditioning.  Appears nonfocal generally weak.  Physical therapy consult. 3. A. fib on Pradaxa.  Patient's discharge summary states that patient is on Coreg but present medication list does not list that.  Will need to confirm with family.  Unable to reach family at this time. 4. Chronic systolic heart failure last 2D echo done last month was 30 to 35%.  Presently holding Lasix due to low normal blood pressure but may need to restart soon as possible once blood pressure stable. 5. CAD on statins and presently on Pradaxa also.  Used to be on beta-blockers but present medication list does not show that.  Will need to confirm with patient's daughter.  On Imdur. 6. History of gout on allopurinol.  Covid test was negative.  Given the SIRS picture will need inpatient status.   DVT prophylaxis: Pradaxa. Code Status: DNR confirmed with the patient. Family Communication: Unable to reach patient's daughter. Disposition Plan: To be  determined. Consults called: Physical therapy. Admission status: Inpatient.   Rise Patience MD [facility] Pager [phone].  If 7PM-7AM, please contact night-coverage [email or web address] Password [email or web address]  05/22/2019, 10:12 PM

## 2019-05-22 NOTE — ED Provider Notes (Signed)
[facility] EMERGENCY DEPARTMENT Provider Note   CSN: [id number] Arrival date & time: 05/22/19  1614     History Chief Complaint  Patient presents with  . Weakness    Karen Stanley is a 68 y.o. female.  Patient with history of heart failure, COPD presents to the ED with generalized weakness.  Patient over the past week plus has been sitting in her recliner due to deconditioning likely per her.  Her daughter has been coming by daily to help her with eating and taking her medications and change in her and cleaning her.  Patient has been in the process of trying to get paperwork for her to move to a skilled nursing facility because she is no longer able to take care of herself.  She does have morbid obesity.  She states that she has had multiple urinary tract infections recently.  Patient found by EMS with low blood pressure covered in stool and urine in her recliner.  The history is provided by the patient.  Weakness Severity:  Moderate Onset quality:  Gradual Timing:  Constant Progression:  Unchanged Chronicity:  Chronic Context: recent infection   Relieved by:  Nothing Worsened by:  Nothing Associated symptoms: dysuria   Associated symptoms: no abdominal pain, no arthralgias, no chest pain, no cough, no fever, no seizures, no shortness of breath and no vomiting        No past medical history on file.  Patient Active Problem List   Diagnosis Date Noted  . HYPERCHOLESTEROLEMIA 02/26/2010  . TOBACCO ABUSE 02/26/2010  . HYPERTENSION 02/26/2010  . CAD 02/26/2010  . EMPHYSEMA 02/26/2010  . COPD 02/26/2010  . GERD 02/26/2010      OB History   No obstetric history on file.     No family history on file.  Social History   Tobacco Use  . Smoking status: Not on file  Substance Use Topics  . Alcohol use: Not on file  . Drug use: Not on file    Home Medications Prior to Admission medications   Not on File    Allergies    Patient has no known  allergies.  Review of Systems   Review of Systems  Constitutional: Negative for chills and fever.  HENT: Negative for ear pain and sore throat.   Eyes: Negative for pain and visual disturbance.  Respiratory: Negative for cough and shortness of breath.   Cardiovascular: Negative for chest pain and palpitations.  Gastrointestinal: Negative for abdominal pain and vomiting.  Genitourinary: Positive for dysuria. Negative for hematuria.  Musculoskeletal: Negative for arthralgias and back pain.  Skin: Positive for wound. Negative for color change and rash.  Neurological: Positive for weakness. Negative for seizures and syncope.  All other systems reviewed and are negative.   Physical Exam Updated Vital Signs  ED Triage Vitals  Enc Vitals Group     BP 05/22/19 1626 (!) 143/75     Pulse Rate 05/22/19 1626 (!) 106     Resp 05/22/19 1626 16     Temp 05/22/19 1626 98.5 F (36.9 C)     Temp Source 05/22/19 1626 Rectal     SpO2 05/22/19 1616 100 %     Weight 05/22/19 1642 296 lb (134.3 kg)     Height 05/22/19 1642  (1.575 m)     Head Circumference --      Peak Flow --      Pain Score --      Pain Loc --  Pain Edu? --      Excl. in GC? --     Physical Exam Vitals and nursing note reviewed.  Constitutional:      General: She is not in acute distress.    Appearance: She is well-developed. She is obese. She is ill-appearing (chronic).  HENT:     Head: Normocephalic and atraumatic.     Nose: Nose normal.     Mouth/Throat:     Mouth: Mucous membranes are moist.  Eyes:     Extraocular Movements: Extraocular movements intact.     Conjunctiva/sclera: Conjunctivae normal.     Pupils: Pupils are equal, round, and reactive to light.  Cardiovascular:     Rate and Rhythm: Normal rate and regular rhythm.     Pulses: Normal pulses.     Heart sounds: Normal heart sounds. No murmur.  Pulmonary:     Effort: Pulmonary effort is normal. No respiratory distress.     Breath sounds:  Normal breath sounds.  Abdominal:     Palpations: Abdomen is soft.     Tenderness: There is no abdominal tenderness.  Musculoskeletal:     Cervical back: Normal range of motion and neck supple.  Skin:    General: Skin is warm and dry.     Comments: Bruising on abdomen and low back likely from pressure ulcer but does not break through the skin, does have some skin breakdown within her upper thigh area also likely from pressure sores, erythema in this area likely cellulitic  Neurological:     General: No focal deficit present.     Mental Status: She is alert.  Psychiatric:        Mood and Affect: Mood normal.     ED Results / Procedures / Treatments   Labs (all labs ordered are listed, but only abnormal results are displayed) Labs Reviewed  LACTIC ACID, PLASMA - Abnormal; Notable for the following components:      Result Value   Lactic Acid, Venous 2.6 (*)    All other components within normal limits  COMPREHENSIVE METABOLIC PANEL - Abnormal; Notable for the following components:   Chloride 95 (*)    Glucose, Bld 126 (*)    BUN 26 (*)    Total Protein 6.4 (*)    Albumin 2.3 (*)    All other components within normal limits  CBC WITH DIFFERENTIAL/PLATELET - Abnormal; Notable for the following components:   WBC 12.3 (*)    MCV 102.8 (*)    Neutro Abs 8.9 (*)    Monocytes Absolute 1.6 (*)    Abs Immature Granulocytes 0.11 (*)    All other components within normal limits  CK - Abnormal; Notable for the following components:   Total CK 26 (*)    All other components within normal limits  CULTURE, BLOOD (ROUTINE X 2)  CULTURE, BLOOD (ROUTINE X 2)  URINE CULTURE  SARS CORONAVIRUS 2 (TAT 6-24 HRS)  URINALYSIS, ROUTINE W REFLEX MICROSCOPIC  LACTIC ACID, PLASMA    EKG EKG Interpretation  Date/Time:  Monday May 22 2019 17:17:33 EST Ventricular Rate:  101 PR Interval:    QRS Duration: 132 QT Interval:  396 QTC Calculation: 513 R Axis:   2 Text Interpretation: Wide  QRS rhythm Left ventricular hypertrophy with QRS widening and repolarization abnormality ( R in aVL , Cornell product ) Abnormal ECG Confirmed by Virgina NorfolkAdam, [place] [phone]) on 05/22/2019 6:55:58 PM   Radiology DG Chest Port 1 View  Result Date: 05/22/2019 CLINICAL DATA:  Weakness. EXAM: PORTABLE CHEST 1 VIEW COMPARISON:  Chest radiograph 05/09/2019 FINDINGS: Redemonstrated left chest AICD device. Mild cardiomegaly is unchanged. Prior CABG. No focal consolidation within the lungs. No evidence of pleural effusion or pneumothorax. No acute bony abnormality. IMPRESSION: No evidence of acute cardiopulmonary abnormality. Stable, mild cardiomegaly. Electronically Signed   By: Jackey Loge DO   On: 05/22/2019 17:12    Procedures .Critical Care Performed by: Virgina Norfolk, DO Authorized by: Virgina Norfolk, DO   Critical care provider statement:    Critical care time (minutes):  35   Critical care was necessary to treat or prevent imminent or life-threatening deterioration of the following conditions:  Sepsis   Critical care was time spent personally by me on the following activities:  Blood draw for specimens, development of treatment plan with patient or surrogate, discussions with primary provider, evaluation of patient's response to treatment, examination of patient, obtaining history from patient or surrogate, ordering and review of laboratory studies, ordering and performing treatments and interventions, ordering and review of radiographic studies, pulse oximetry, re-evaluation of patient's condition and review of old charts   I assumed direction of critical care for this patient from another provider in my specialty: no     (including critical care time)  Medications Ordered in ED Medications  cefTRIAXone (ROCEPHIN) 2 g in sodium chloride 0.9 % 100 mL IVPB (0 g Intravenous Stopped 05/22/19 1808)  0.9 %  sodium chloride infusion (has no administration in time range)  sodium chloride 0.9 % bolus  500 mL (0 mLs Intravenous Stopped 05/22/19 1738)    ED Course  I have reviewed the triage vital signs and the nursing notes.  Pertinent labs & imaging results that were available during my care of the patient were reviewed by me and considered in my medical decision making (see chart for details).    MDM Rules/Calculators/A&P  Keyra Virella is a 68 year old female with history of COPD, CHF, morbid obesity presents to the ED with low blood pressure, failure to thrive, concern for dehydration.  Patient recently discharged from the hospital about 10 days ago.  Has basically been just sitting at home in a recliner.  She has severe generalized weakness that has gotten worse over the last several weeks.  She is unable to take care of her self at home.  A family member who does not live at home with her has been helping her with food and medications.  She was found to be more weak and lethargic today and EMS was called out.  She was found to be covered in her stool and urine.  Patient was found to have a blood pressure in the 70s.  Was given some fluids by EMS.  Blood pressure has improved upon arrival.  She appears clinically dehydrated.  She overall appears chronically ill.  She has multiple areas of skin breakdown from likely pressure ulcer.  She has possibly cellulitis in her in her groins with skin breakdown.  Will treat with Rocephin.  Does not have a fever however.  Infectious labs are ordered.  Lactic acid is 2.6.  Given IV fluid hydration and will start maintenance fluids.  Otherwise urinalysis negative for infection.  Chest x-ray negative for infection.  Creatinine and other electrolytes are overall unremarkable.  Overall patient has had improvement believe that she is clinically dehydrated.  She is clearly unable to take care of herself at home and needs placement which it appears that family has been likely working on.  She would  benefit from PT/OT and wound care consultation as well as antibiotics  for cellulitis.  Given white count and lactic acid concern for possible sepsis.  Blood cultures have been collected.  This chart was dictated using voice recognition software.  Despite best efforts to proofread,  errors can occur which can change the documentation meaning.   Final Clinical Impression(s) / ED Diagnoses Final diagnoses:  Weakness  Dehydration  Cellulitis, unspecified cellulitis site    Rx / DC Orders ED Discharge Orders    None       Lennice Sites, DO 05/22/19 1946

## 2019-05-22 NOTE — ED Triage Notes (Signed)
Pt here via EMS, recent dc from [place] for CHF/COPD symptoms. Has been sitting in same recliner for 9 days (since d/c), the last two days being sitting in her own feces and urine. 70 palp bp, no access on arrival. Decubitus ulcers on back side. Pain to private area and buttocks. APS contacted.

## 2019-05-23 ENCOUNTER — Encounter (HOSPITAL_COMMUNITY): Payer: Self-pay | Admitting: *Deleted

## 2019-05-23 DIAGNOSIS — L8990 Pressure ulcer of unspecified site, unspecified stage: Secondary | ICD-10-CM | POA: Insufficient documentation

## 2019-05-23 LAB — MRSA PCR SCREENING: MRSA by PCR: NEGATIVE

## 2019-05-23 LAB — LACTIC ACID, PLASMA
Lactic Acid, Venous: 2.2 mmol/L (ref 0.5–1.9)
Lactic Acid, Venous: 2 mmol/L (ref 0.5–1.9)

## 2019-05-23 LAB — HIV ANTIBODY (ROUTINE TESTING W REFLEX): HIV Screen 4th Generation wRfx: NONREACTIVE

## 2019-05-23 LAB — SARS CORONAVIRUS 2 (TAT 6-24 HRS): SARS Coronavirus 2: NEGATIVE

## 2019-05-23 MED ORDER — TIOTROPIUM BROMIDE MONOHYDRATE 18 MCG IN CAPS: 18.0000 ug | ORAL_CAPSULE | Freq: Every day | RESPIRATORY_TRACT | Status: DC

## 2019-05-23 MED ORDER — VANCOMYCIN HCL 1500 MG/300ML IV SOLN
1500.0000 mg | INTRAVENOUS | Status: DC
  Administered 2019-05-24: 1500 mg via INTRAVENOUS
  Filled 2019-05-23: qty 300

## 2019-05-23 MED ORDER — MOMETASONE FURO-FORMOTEROL FUM 100-5 MCG/ACT IN AERO
2.0000 | INHALATION_SPRAY | Freq: Two times a day (BID) | RESPIRATORY_TRACT | Status: DC
  Administered 2019-05-23 – 2019-05-29 (×12): 2 via RESPIRATORY_TRACT
  Filled 2019-05-23: qty 8.8

## 2019-05-23 MED ORDER — IPRATROPIUM-ALBUTEROL 0.5-2.5 (3) MG/3ML IN SOLN
3.0000 mL | Freq: Three times a day (TID) | RESPIRATORY_TRACT | Status: DC
  Administered 2019-05-24 – 2019-05-26 (×7): 3 mL via RESPIRATORY_TRACT
  Filled 2019-05-23 (×7): qty 3

## 2019-05-23 MED ORDER — COLLAGENASE 250 UNIT/GM EX OINT
TOPICAL_OINTMENT | Freq: Every day | CUTANEOUS | Status: DC
  Filled 2019-05-23: qty 30

## 2019-05-23 MED ORDER — SODIUM CHLORIDE 0.9 % IV BOLUS
250.0000 mL | Freq: Once | INTRAVENOUS | Status: AC
  Administered 2019-05-23: 250 mL via INTRAVENOUS

## 2019-05-23 MED ORDER — SODIUM CHLORIDE 0.9 % IV SOLN
INTRAVENOUS | Status: DC | PRN
  Administered 2019-05-24 – 2019-05-25 (×3): 1000 mL via INTRAVENOUS

## 2019-05-23 MED ORDER — CHLORHEXIDINE GLUCONATE CLOTH 2 % EX PADS
6.0000 | MEDICATED_PAD | Freq: Every day | CUTANEOUS | Status: DC
  Administered 2019-05-23 – 2019-05-29 (×7): 6 via TOPICAL

## 2019-05-23 MED ORDER — UMECLIDINIUM BROMIDE 62.5 MCG/INH IN AEPB
1.0000 | INHALATION_SPRAY | Freq: Every day | RESPIRATORY_TRACT | Status: DC
  Administered 2019-05-24 – 2019-05-29 (×6): 1 via RESPIRATORY_TRACT
  Filled 2019-05-23: qty 7

## 2019-05-23 MED ORDER — IPRATROPIUM-ALBUTEROL 0.5-2.5 (3) MG/3ML IN SOLN
3.0000 mL | RESPIRATORY_TRACT | Status: DC
  Administered 2019-05-23: 3 mL via RESPIRATORY_TRACT
  Filled 2019-05-23 (×2): qty 3

## 2019-05-23 MED ORDER — SODIUM CHLORIDE 0.9 % IV SOLN: Freq: Once | INTRAVENOUS | Status: DC

## 2019-05-23 MED ORDER — GERHARDT'S BUTT CREAM
TOPICAL_CREAM | Freq: Three times a day (TID) | CUTANEOUS | Status: DC
  Administered 2019-05-23: 1 via TOPICAL
  Filled 2019-05-23 (×2): qty 1

## 2019-05-23 MED ORDER — VANCOMYCIN HCL 10 G IV SOLR
2500.0000 mg | INTRAVENOUS | Status: AC
  Administered 2019-05-23: 2500 mg via INTRAVENOUS
  Filled 2019-05-23: qty 2500

## 2019-05-23 NOTE — Consult Note (Addendum)
[place] Nurse Consult Note: Reason for Consult: Consult requested for buttocks and groin.  Pt has severe moisture associated skin damage (MASD) to these sites and scattered areas of full thickness wounds in patchy areas across bilat buttocks.   Full thickness wounds are in several locations; each is approx .3X.3X.2cm, yellow slough and small amt yellow drainage.  Appearance is consistent with wounds caused by moisture, not pressure.  Inner groin and bilat buttocks are red, moist, macerated and painful from MASD.   Pt has multiple areas of dark red-purple deep tissue injuries; approx 4X2cm across buttocks, and across all areas where she was previously wearing briefs and the elastic was too tight across bilat upper thighs and posterior back; these are linear and high risk to evolve into full thickness tissue loss. Pt states she wore briefs for a prolonged period of time while sitting up and they were soiled with urine and stool prior to admission.  She currently has a Purewick to attempt to contain urine and keep the affected areas dry to promote healing.  Dressing procedure/placement/frequency: Topical treatment orders provided for bedside nurses to perform: Air mattress ordered to decrease pressure to the affected areas and decrease pressure.  Gerhardts cream to repel moisture and promote healing.  Santyl ointment to provide chemical debridement to wounds with slough on buttocks. Please re-consult if further assistance is needed.  Thank-you,  Julien Girt MSN, [place], [place], [place], [place]

## 2019-05-23 NOTE — Progress Notes (Signed)
Lactic acid still elevated at 2.2 upon admission to unit. IV fluids were given in the ED. MD paged.

## 2019-05-23 NOTE — Evaluation (Signed)
Physical Therapy Evaluation Patient Details Name: Karen Stanley MRN: [id number] DOB: 1950-07-09 Today's Date: 05/23/2019   History of Present Illness  presented to ED for weakness and unable to get up from recliner for 9 days, recent admit to [facility] for CHF exacerbation, EMS found hypotensive on arrival, chest xray unremarkable, elevated lactate and WBC concern for sepsis, woulds on thigh and buttocksPMH: CAD s/p CABG, HF with EF 30-35%  Clinical Impression  Pt 68 yo female admitted for above. Pt received supine in bed appearing very fatigued and lethargic but agreeable to PT. Pt reports living with her husband in a one level home with 8-10 steps with Left handrail. Pt reports husband is limited in amount of physical assist he can provide pt as he has some mobility deficits of his own. Pt oriented x4. VSS t/o. Pt required max assist for rolling either direction and cuing for technique. Pt will likely require at least +2 assist to get EOB or for OOB mobility at this time. Pt presents with decreased strength, UE grossly 2/5 Bil, LE grossly 3-/5 Bil. Pt performed supine LE therex with cuing and physical assist for correct performance. Pt encouraged to perform therex on her own for improved strengthening. Pt presents with decreased strength, balance and activity tolerance limiting functional mobility. Pt would benefit from acute PT to improve noted deficits and recommendation for SNF following discharge to improve functional mobility, decrease fall risk and caregiver burden.     Follow Up Recommendations SNF    Equipment Recommendations  Other (comment)(TBD next venue of care)    Recommendations for Other Services       Precautions / Restrictions Precautions Precautions: Fall Restrictions Weight Bearing Restrictions: No      Mobility  Bed Mobility Overal bed mobility: Needs Assistance Bed Mobility: Rolling Rolling: Max assist         General bed mobility comments: pt  required max A to roll either direction, cuing for technique, would benefit from +2 for physical assist, pt required A to grab bed rail but then able to assist some by pulling on rail  Transfers                 General transfer comment: deferred for safety  Ambulation/Gait             General Gait Details: unable  Stairs            Wheelchair Mobility    Modified Rankin (Stroke Patients Only)       Balance                                             Pertinent Vitals/Pain Pain Assessment: No/denies pain    Home Living Family/patient expects to be discharged to:: Private residence Living Arrangements: Spouse/significant other Available Help at Discharge: Family(husband limited in assistance he can provide) Type of Home: House Home Access: Stairs to enter Entrance Stairs-Rails: Left Entrance Stairs-Number of Steps: 8-10 Home Layout: One level Home Equipment: Shower seat      Prior Function Level of Independence: Needs assistance         Comments: pt reporting she was mostly independent able to dress and bathe, needing help getting in and out of bed because they havent had a box spring per pt     Hand Dominance        Extremity/Trunk Assessment  Upper Extremity Assessment Upper Extremity Assessment: Generalized weakness(UE strength grossly 2/5)    Lower Extremity Assessment Lower Extremity Assessment: Generalized weakness(LE strength grossly 3-/5, pt able to perform heel slide, SAQ ind)       Communication   Communication: No difficulties  Cognition Arousal/Alertness: Lethargic Behavior During Therapy: WFL for tasks assessed/performed Overall Cognitive Status: No family/caregiver present to determine baseline cognitive functioning                                 General Comments: lethargic often appearing to fall asleep during conversation, oriented to self, time, situation and place, appears to have  some cognitive difficulty and dec STM but no famly present      General Comments General comments (skin integrity, edema, etc.): B LE edema    Exercises Total Joint Exercises Ankle Circles/Pumps: AROM;Both;20 reps Short Arc Quad: AROM;Both;10 reps Heel Slides: AROM;Both;10 reps Hip ABduction/ADduction: AAROM;Both;10 reps Straight Leg Raises: AAROM;Both;10 reps General Exercises - Upper Extremity Shoulder Flexion: AAROM;Both;5 reps   Assessment/Plan    PT Assessment Patient needs continued PT services  PT Problem List Decreased strength;Decreased mobility;Decreased safety awareness;Decreased range of motion;Decreased activity tolerance;Decreased balance;Decreased knowledge of use of DME;Cardiopulmonary status limiting activity;Decreased skin integrity;Obesity       PT Treatment Interventions DME instruction;Therapeutic exercise;Gait training;Balance training;Stair training;Functional mobility training;Therapeutic activities;Patient/family education    PT Goals (Current goals can be found in the Care Plan section)  Acute Rehab PT Goals Patient Stated Goal: get stronger PT Goal Formulation: With patient Time For Goal Achievement: 06/06/19 Potential to Achieve Goals: Fair    Frequency Min 2X/week   Barriers to discharge Decreased caregiver support;Inaccessible home environment      Co-evaluation               AM-PAC PT "6 Clicks" Mobility  Outcome Measure Help needed turning from your back to your side while in a flat bed without using bedrails?: A Lot Help needed moving from lying on your back to sitting on the side of a flat bed without using bedrails?: A Lot Help needed moving to and from a bed to a chair (including a wheelchair)?: Total Help needed standing up from a chair using your arms (e.g., wheelchair or bedside chair)?: Total Help needed to walk in hospital room?: Total Help needed climbing 3-5 steps with a railing? : Total 6 Click Score: 8    End of  Session Equipment Utilized During Treatment: Oxygen Activity Tolerance: Patient limited by fatigue;Patient limited by lethargy Patient left: in bed;with call bell/phone within reach Nurse Communication: Mobility status PT Visit Diagnosis: Muscle weakness (generalized) (M62.81);Difficulty in walking, not elsewhere classified (R26.2)    Time: 1751-0258 PT Time Calculation (min) (ACUTE ONLY): 25 min   Charges:   PT Evaluation $PT Eval Moderate Complexity: 1 Mod PT Treatments $Therapeutic Exercise: 8-22 mins        Karoline Caldwell PT, DPT 12:34 PM,05/23/19   Jashiya Bassett Shyrl Numbers 05/23/2019, 12:30 PM

## 2019-05-23 NOTE — Progress Notes (Addendum)
PROGRESS NOTE    Karen Stanley  XBD:[id number] DOB: [birth year] DOA: 05/22/2019 PCP: System, Pcp Not In  Brief Narrative: 68 y.o. female with history of CAD status post CABG, systolic heart failure with last EF measured was 30 to 35% in November 2020 last month admitted at [facility] for CHF exacerbation eventually discharged home has been feeling weak and unable to get up from her recliner for almost 9 days now.  Patient's medication and food has been managed by her daughter.  Given that patient does have unable to ambulate EMS was called per EMS on arrival as per the report found patient to be hypotensive.  Was given fluid bolus and brought to the ER.  ED Course: As ordered a 1 L bolus was given for which blood pressure improved.  Lactate was elevated at 2.6 which improved to 2.2.  WBC count was 12.3.  Chest x-ray unremarkable Covid test was negative.  Given the hypotension and elevated lactate with WBC count concern for possible sepsis.  UA is unremarkable but on exam patient has cellulitis of the thigh area mostly in the medial aspect and some wounds on the buttock area.  Patient admitted for further management of SIRS likely from cellulitis and may need rehab.  Assessment & Plan:   Principal Problem:   SIRS (systemic inflammatory response syndrome) (HCC) Active Problems:   Essential hypertension   Coronary atherosclerosis   PAF (paroxysmal atrial fibrillation) (HCC)   Cellulitis   Cardiomyopathy (HCC) 30-35 % EF 11/20   #1 cellulitis of the left inner thigh patient admitted with increasing lower extremity edema and left lower extremity erythema leukocytosis hypotension and lactic acid elevation.  She was given IV fluids and started on Vanco and Rocephin.  Lactate level trending down from 2.6 at the time of admission to 2.2 to 2.0 today.  Check MRSA PCR.  Her blood pressure is still soft but with her ejection fraction being 30 to 35% I will hold off on IV fluids for now.  Will  monitor closely.    #2 chronic systolic heart failure ejection fraction 30 to 35%-Lasix is currently on hold due to soft blood pressure will need to reevaluate daily to determine when to restart Lasix.  She uses 2 L of oxygen at night and as needed.  Currently she is on room air saturation 98% on room air. She reports she was on Spiriva, Dulera, and nebulizers every 4 prior to admission though this is not in the med list.  #3 A. fib on Pradaxa continue.?  Unclear if he was on Coreg at home.  Will check with her daughter.  However her her blood pressure is too soft to restart Coreg at this time.  #4 CAD/hyperlipidemia continue statins and Pradaxa.  She is on Imdur which is on hold due to soft BP.  #5 history of gout on allopurinol.  #6 severe deconditioning and debility will order PT and OT consult patient most likely will need rehab.  #7 moisture associated skin damage to the buttocks and groin-appreciate wound care input.  Patient reports she was on diaper for multiple days and was sitting in feces and urine for long periods of time.  Topical treatments per wound care nurse.  Air mattress ordered.  But cream to Ripple moisture and promote healing and Santyl for chemical debridements to wounds with slough's on the buttocks.    Pressure Injury 05/23/19 Buttocks Right Stage 2 -  Partial thickness loss of dermis presenting as a shallow open  injury with a red, pink wound bed without slough. these are full thickness wounds across bilat buttocks related to moisture, not pressure (Active)  05/23/19 0016  Location: Buttocks  Location Orientation: Right  Staging: Stage 2 -  Partial thickness loss of dermis presenting as a shallow open injury with a red, pink wound bed without slough.  Wound Description (Comments): these are full thickness wounds across bilat buttocks related to moisture, not pressure  Present on Admission: Yes     Pressure Injury 05/23/19 Buttocks Right;Left Deep Tissue Pressure  Injury - Purple or maroon localized area of discolored intact skin or blood-filled blister due to damage of underlying soft tissue from pressure and/or shear. deep tissue pressure injuries (Active)  05/23/19   Location: Buttocks  Location Orientation: Right;Left  Staging: Deep Tissue Pressure Injury - Purple or maroon localized area of discolored intact skin or blood-filled blister due to damage of underlying soft tissue from pressure and/or shear.  Wound Description (Comments): deep tissue pressure injuries where elastic from briefs was located prior to admission across bilat thighs, posterior back, buttocks  Present on Admission: Yes    Estimated body mass index is 50.24 kg/m as calculated from the following:   Height as of this encounter: 5\' 2"  (1.575 m).   Weight as of this encounter: 124.6 kg.  DVT prophylaxis: Pradaxa Code Status: DNR Family Communication: Attempted to call the daughter with no response Disposition Plan: Pending clinical improvement PT and OT evaluation    Consultants:   None  Procedures: None Antimicrobials: Vancomycin and Rocephin  Subjective: Resting in bed talking on the phone no complaints of shortness of breath lives at home with her husband and daughter.  Objective: Vitals:   05/22/19 2130 05/23/19 0016 05/23/19 0438 05/23/19 0813  BP: 130/84 127/75 120/62 (!) 100/52  Pulse: 98 98 97 97  Resp: (!) 24 (!) 22 18   Temp:  (!) 97.4 F (36.3 C) (!) 97.5 F (36.4 C) 98 F (36.7 C)  TempSrc:  Oral Oral   SpO2: 100% 100% 98% 98%  Weight:  124.6 kg    Height:  5\' 2"  (1.575 m)      Intake/Output Summary (Last 24 hours) at 05/23/2019 0942 Last data filed at 05/23/2019 0847 Gross per 24 hour  Intake 820 ml  Output 150 ml  Net 670 ml   Filed Weights   05/22/19 1642 05/23/19 0016  Weight: 134.3 kg 124.6 kg    Examination:  General exam: Appears calm and comfortable  Respiratory system: Clear to auscultation. Respiratory effort  normal. Cardiovascular system: S1 & S2 heard, RRR. No JVD, murmurs, rubs, gallops or clicks. No pedal edema. Gastrointestinal system: Abdomen is nondistended, soft and nontender. No organomegaly or masses felt. Normal bowel sounds heard. Central nervous system: Alert and oriented. No focal neurological deficits. Extremities: 1+ pitting edema to bilateral lower extremity with erythema to the left medial thigh.   Skin: Multiple areas of deep tissue injuries on the buttocks and groin. Psychiatry: Judgement and insight appear normal. Mood & affect appropriate.     Data Reviewed: I have personally reviewed following labs and imaging studies  CBC: Recent Labs  Lab 05/22/19 1643  WBC 12.3*  NEUTROABS 8.9*  HGB 13.5  HCT 41.1  MCV 102.8*  PLT 333   Basic Metabolic Panel: Recent Labs  Lab 05/22/19 1643  NA 136  K 4.4  CL 95*  CO2 30  GLUCOSE 126*  BUN 26*  CREATININE 0.80  CALCIUM 9.3   GFR:  Estimated Creatinine Clearance: 83.7 mL/min (by C-G formula based on SCr of 0.8 mg/dL). Liver Function Tests: Recent Labs  Lab 05/22/19 1643  AST 24  ALT 16  ALKPHOS 78  BILITOT 1.0  PROT 6.4*  ALBUMIN 2.3*   No results for input(s): LIPASE, AMYLASE in the last 168 hours. No results for input(s): AMMONIA in the last 168 hours. Coagulation Profile: No results for input(s): INR, PROTIME in the last 168 hours. Cardiac Enzymes: Recent Labs  Lab 05/22/19 1643  CKTOTAL 26*   BNP (last 3 results) No results for input(s): PROBNP in the last 8760 hours. HbA1C: No results for input(s): HGBA1C in the last 72 hours. CBG: No results for input(s): GLUCAP in the last 168 hours. Lipid Profile: No results for input(s): CHOL, HDL, LDLCALC, TRIG, CHOLHDL, LDLDIRECT in the last 72 hours. Thyroid Function Tests: No results for input(s): TSH, T4TOTAL, FREET4, T3FREE, THYROIDAB in the last 72 hours. Anemia Panel: No results for input(s): VITAMINB12, FOLATE, FERRITIN, TIBC, IRON, RETICCTPCT  in the last 72 hours. Sepsis Labs: Recent Labs  Lab 05/22/19 1643 05/23/19 0103 05/23/19 7829  LATICACIDVEN 2.6* 2.2* 2.0*    Recent Results (from the past 240 hour(s))  SARS CORONAVIRUS 2 (TAT 6-24 HRS) Nasopharyngeal Nasopharyngeal Swab     Status: None   Collection Time: 05/22/19  7:08 PM   Specimen: Nasopharyngeal Swab  Result Value Ref Range Status   SARS Coronavirus 2 NEGATIVE NEGATIVE Final    Comment: (NOTE) SARS-CoV-2 target nucleic acids are NOT DETECTED. The SARS-CoV-2 RNA is generally detectable in upper and lower respiratory specimens during the acute phase of infection. Negative results do not preclude SARS-CoV-2 infection, do not rule out co-infections with other pathogens, and should not be used as the sole basis for treatment or other patient management decisions. Negative results must be combined with clinical observations, patient history, and epidemiological information. The expected result is Negative. Fact Sheet for Patients: HairSlick.no Fact Sheet for Healthcare Providers: quierodirigir.com This test is not yet approved or cleared by the [place] FDA and  has been authorized for detection and/or diagnosis of SARS-CoV-2 by FDA under an Emergency Use Authorization (EUA). This EUA will remain  in effect (meaning this test can be used) for the duration of the COVID-19 declaration under Section 56 4(b)(1) of the Act, 21 U.S.C. section 360bbb-3(b)(1), unless the authorization is terminated or revoked sooner. Performed at [facility], [address], [place], [place] [address]          Radiology Studies: DG Chest Port 1 View  Result Date: 05/22/2019 CLINICAL DATA:  Weakness. EXAM: PORTABLE CHEST 1 VIEW COMPARISON:  Chest radiograph 05/09/2019 FINDINGS: Redemonstrated left chest AICD device. Mild cardiomegaly is unchanged. Prior CABG. No focal consolidation within the lungs. No evidence  of pleural effusion or pneumothorax. No acute bony abnormality. IMPRESSION: No evidence of acute cardiopulmonary abnormality. Stable, mild cardiomegaly. Electronically Signed   By: Jackey Loge DO   On: 05/22/2019 17:12        Scheduled Meds: . allopurinol  300 mg Oral Daily  . collagenase   Topical Daily  . dabigatran  150 mg Oral BID  . gabapentin  600 mg Oral TID  . Gerhardt's butt cream   Topical TID  . isosorbide mononitrate  30 mg Oral Daily  . pantoprazole  40 mg Oral Daily  . rosuvastatin  40 mg Oral Daily  . venlafaxine XR  150 mg Oral Q breakfast   Continuous Infusions: . sodium chloride    .  cefTRIAXone (ROCEPHIN)  IV Stopped (05/22/19 1808)  . vancomycin 2,500 mg (05/23/19 0854)  . [START ON 05/24/2019] vancomycin       LOS: 1 day     Georgette Shell, MD [facility]  If 7PM-7AM, please contact night-coverage [email or web address] Password [facility] 05/23/2019, 9:42 AM

## 2019-05-23 NOTE — Progress Notes (Signed)
Pharmacy Antibiotic Note  Karen Stanley is a 68 y.o. female admitted on 05/22/2019 with cellulitis.  Pharmacy has been consulted for Vancomycin dosing.  CC/HPI: From [facility] with CHF/COPD symptoms.. Recent discharge and has been sitting in a recliner for 9 days. Decubitus ulcers on back side.  PMH: HF with EF 30-35% 11/20, COPD with emphysema, morbid obesity, multiple UTI's, tobacco, HLD, HTN, CAD s/p CABG, GERD, gout   ID: Cellulitis of thigh area up to buttocks, Afebrile. WBC 12.3. Scr 0.8. LA 2.6>2.  Rocephin 12/28>> Vanco 12/29>>  Vancomycin 1500 mg IV Q 24 hrs. Goal AUC 400-550. Expected AUC: 481.8 SCr used: 0.8  Plan: Vanco 2500mg  IV x 1 then 1500mg  IV q 24h Peak/trough levels at stead state if continued Rocephin 2g IV q 24h     Height: 5\' 2"  (157.5 cm) Weight: 274 lb 11.1 oz (124.6 kg) IBW/kg (Calculated) : 50.1  Temp (24hrs), Avg:97.9 F (36.6 C), Min:97.4 F (36.3 C), Max:98.5 F (36.9 C)  Recent Labs  Lab 05/22/19 1643 05/23/19 0103 05/23/19 0605  WBC 12.3*  --   --   CREATININE 0.80  --   --   LATICACIDVEN 2.6* 2.2* 2.0*    Estimated Creatinine Clearance: 83.7 mL/min (by C-G formula based on SCr of 0.8 mg/dL).    No Known Allergies   Halton Neas S. Alford Highland, PharmD, BCPS Clinical Staff Pharmacist [email or web address] [place] 05/23/2019 8:29 AM

## 2019-05-24 LAB — COMPREHENSIVE METABOLIC PANEL
ALT: 13 U/L (ref 0–44)
AST: 20 U/L (ref 15–41)
Albumin: 1.6 g/dL — ABNORMAL LOW (ref 3.5–5.0)
Alkaline Phosphatase: 54 U/L (ref 38–126)
Anion gap: 11 (ref 5–15)
BUN: 15 mg/dL (ref 8–23)
CO2: 24 mmol/L (ref 22–32)
Calcium: 8.5 mg/dL — ABNORMAL LOW (ref 8.9–10.3)
Chloride: 106 mmol/L (ref 98–111)
Creatinine, Ser: 0.83 mg/dL (ref 0.44–1.00)
GFR calc Af Amer: >60 mL/min (ref >60)
GFR calc non Af Amer: >60 mL/min (ref >60)
Glucose, Bld: 85 mg/dL (ref 70–99)
Potassium: 3.5 mmol/L (ref 3.5–5.1)
Sodium: 141 mmol/L (ref 135–145)
Total Bilirubin: 0.6 mg/dL (ref 0.3–1.2)
Total Protein: 4.7 g/dL — ABNORMAL LOW (ref 6.5–8.1)

## 2019-05-24 LAB — CBC WITH DIFFERENTIAL/PLATELET
Abs Immature Granulocytes: 0.04 10*3/uL (ref 0.00–0.07)
Basophils Absolute: 0 10*3/uL (ref 0.0–0.1)
Basophils Relative: 1 %
Eosinophils Absolute: 0.2 10*3/uL (ref 0.0–0.5)
Eosinophils Relative: 3 %
HCT: 30 % — ABNORMAL LOW (ref 36.0–46.0)
Hemoglobin: 9.6 g/dL — ABNORMAL LOW (ref 12.0–15.0)
Immature Granulocytes: 1 %
Lymphocytes Relative: 21 %
Lymphs Abs: 1.3 10*3/uL (ref 0.7–4.0)
MCH: 33 pg (ref 26.0–34.0)
MCHC: 32 g/dL (ref 30.0–36.0)
MCV: 103.1 fL — ABNORMAL HIGH (ref 80.0–100.0)
Monocytes Absolute: 0.8 10*3/uL (ref 0.1–1.0)
Monocytes Relative: 13 %
Neutro Abs: 3.9 10*3/uL (ref 1.7–7.7)
Neutrophils Relative %: 61 %
Platelets: 236 10*3/uL (ref 150–400)
RBC: 2.91 MIL/uL — ABNORMAL LOW (ref 3.87–5.11)
RDW: 13.7 % (ref 11.5–15.5)
WBC: 6.3 10*3/uL (ref 4.0–10.5)
nRBC: 0 % (ref 0.0–0.2)

## 2019-05-24 LAB — URINE CULTURE: Culture: >=100000 — AB

## 2019-05-24 MED ORDER — CEFAZOLIN SODIUM-DEXTROSE 2-4 GM/100ML-% IV SOLN
2.0000 g | Freq: Three times a day (TID) | INTRAVENOUS | Status: DC
  Administered 2019-05-24 – 2019-05-29 (×15): 2 g via INTRAVENOUS
  Filled 2019-05-24 (×17): qty 100

## 2019-05-24 MED ORDER — ADULT MULTIVITAMIN W/MINERALS CH
1.0000 | ORAL_TABLET | Freq: Every day | ORAL | Status: DC
  Administered 2019-05-24 – 2019-05-29 (×6): 1 via ORAL
  Filled 2019-05-24 (×6): qty 1

## 2019-05-24 MED ORDER — PRO-STAT SUGAR FREE PO LIQD
30.0000 mL | Freq: Three times a day (TID) | ORAL | Status: DC
  Administered 2019-05-24 – 2019-05-29 (×10): 30 mL via ORAL
  Filled 2019-05-24 (×13): qty 30

## 2019-05-24 NOTE — Progress Notes (Signed)
PROGRESS NOTE    Karen Stanley  PFX:[id number] DOB: 16-Jan-1951 DOA: 05/22/2019 PCP: System, Pcp Not In   Brief Narrative: 68 y.o.femalewithhistory of CAD status post CABG, systolic heart failure with last EF measured was 30 to 35% in November 2020 last month admitted at [facility] for CHF exacerbation eventually discharged home has been feeling weak and unable to get up from her recliner for almost 9 days now. Patient's medication and food has been managed by her daughter. Given that patient does have unable to ambulate EMS was called per EMS on arrival as per the report found patient to be hypotensive. Was given fluid bolus and brought to the ER.  ED Course:As ordered a 1 L bolus was given for which blood pressure improved. Lactate was elevated at 2.6 which improved to 2.2. WBC count was 12.3. Chest x-ray unremarkable Covid test was negative. Given the hypotension and elevated lactate with WBC count concern for possible sepsis. UA is unremarkable but on exam patient has cellulitis of the thigh area mostly in the medial aspect and some wounds on the buttock area. Patient admitted for further management of SIRS likely from cellulitis and may need rehab.  Assessment & Plan:   Principal Problem:   SIRS (systemic inflammatory response syndrome) (HCC) Active Problems:   Essential hypertension   Coronary atherosclerosis   PAF (paroxysmal atrial fibrillation) (HCC)   Cellulitis   Cardiomyopathy ([place]) 30-35 % EF 11/20   Pressure injury of skin   #1 cellulitis of the left inner thigh patient admitted with increasing lower extremity edema and left lower extremity erythema leukocytosis hypotension and lactic acid elevation.  She was given IV fluids and started on Vanco and Rocephin.  Lactate level trending down from 2.6 at the time of admission to 2.2 to 2.0 today. MRSA PCR negative.  DC vancomycin.  Check MRSA PCR. BP still soft 91/55. Continue to monitor off IV fluids. She  already has pitting edema to lower extremities.  #2 chronic systolic heart failure ejection fraction 30 to 35%-continue to hold Lasix.  She is not in any respiratory distress at this time.  She is on 2 L of oxygen which she was on prior to admission.  Lasix is currently on hold due to soft blood pressure.  Reevaluate daily to determine when to restart Lasix.   She reports she was on Spiriva, Dulera, and nebulizers every 4 prior to admission though this is not in the med list.  #3 A. fib on Pradaxa continue.  Rate controlled.  Not on any medications for rate control.  #4 CAD/hyperlipidemia continue statins and Pradaxa.  She is on Imdur which is on hold due to soft BP.  #5 history of gout on allopurinol.  #6 severe deconditioning and debility patient seen by physical therapy and recommends SNF.   #7 moisture associated skin damage to the buttocks and groin-appreciate wound care input.  Patient reports she was on diaper for multiple days and was sitting in feces and urine for long periods of time.  Topical treatments per wound care nurse.  Air mattress ordered.  But cream to Ripple moisture and promote healing and Santyl for chemical debridements to wounds with slough's on the buttocks.  #8 COPD patient on Spiriva and Dulera nebulizers at home which has been restarted.  #9 anemia macrocytic hemoglobin dropped from 13-9 with no evidence of active bleeding likely due to hemodilution from IV hydration.  However will check FOBT and B12 and folate. Pressure Injury 05/23/19 Buttocks Right  Stage 2 -  Partial thickness loss of dermis presenting as a shallow open injury with a red, pink wound bed without slough. these are full thickness wounds across bilat buttocks related to moisture, not pressure (Active)  05/23/19 0016  Location: Buttocks  Location Orientation: Right  Staging: Stage 2 -  Partial thickness loss of dermis presenting as a shallow open injury with a red, pink wound bed without slough.    Wound Description (Comments): these are full thickness wounds across bilat buttocks related to moisture, not pressure  Present on Admission: Yes     Pressure Injury 05/23/19 Buttocks Right;Left Deep Tissue Pressure Injury - Purple or maroon localized area of discolored intact skin or blood-filled blister due to damage of underlying soft tissue from pressure and/or shear. deep tissue pressure injuries (Active)  05/23/19   Location: Buttocks  Location Orientation: Right;Left  Staging: Deep Tissue Pressure Injury - Purple or maroon localized area of discolored intact skin or blood-filled blister due to damage of underlying soft tissue from pressure and/or shear.  Wound Description (Comments): deep tissue pressure injuries where elastic from briefs was located prior to admission across bilat thighs, posterior back, buttocks  Present on Admission: Yes    Estimated body mass index is 48.83 kg/m as calculated from the following:   Height as of this encounter:  (1.575 m).   Weight as of this encounter: 121.1 kg.  DVT prophylaxis: Pradaxa Code Status: DNR Family Communication: Attempted to call the daughter with no response Disposition Plan: Pending clinical improvement PT recommends SNF.toc consulted. Consultants:   None  Procedures: None Antimicrobials: Vancomycin and Rocephin   Subjective: No nausea vomiting  No diarhea No chest pain   Objective: Vitals:   05/23/19 1950 05/24/19 0021 05/24/19 0442 05/24/19 0825  BP:  (!) 93/44 (!) 96/37 (!) 91/55  Pulse:  92 91 93  Resp:  Temp:  (!) 97.5 F (36.4 C) (!) 97.4 F (36.3 C) (!) 97.5 F (36.4 C)  TempSrc:  Oral Oral   SpO2: 99% 98% 100% 100%  Weight:  121.1 kg    Height:        Intake/Output Summary (Last 24 hours) at 05/24/2019 0850 Last data filed at 05/24/2019 0443 Gross per 24 hour  Intake 240 ml  Output 500 ml  Net -260 ml   Filed Weights   05/22/19 1642 05/23/19 0016 05/24/19 0021  Weight:  134.3 kg 124.6 kg 121.1 kg    Examination:  General exam: Appears calm and comfortable  Respiratory system: Diminished breath sounds at the bases to auscultation. Respiratory effort normal. Cardiovascular system: S1 & S2 heard, RRR. No JVD, murmurs, rubs, gallops or clicks. No pedal edema. Gastrointestinal system: Abdomen is nondistended, soft and nontender. No organomegaly or masses felt. Normal bowel sounds heard. Central nervous system: Alert and oriented. No focal neurological deficits. Extremities 1+ pitting edema  skin: Erythema to the inner upper bilateral thighs psychiatry: Judgement and insight appear normal. Mood & affect appropriate.     Data Reviewed: I have personally reviewed following labs and imaging studies  CBC: Recent Labs  Lab 05/22/19 1643 05/24/19 0518  WBC 12.3* 6.3  NEUTROABS 8.9* 3.9  HGB 13.5 9.6*  HCT 41.1 30.0*  MCV 102.8* 103.1*  PLT 333 236   Basic Metabolic Panel: Recent Labs  Lab 05/22/19 1643 05/24/19 0518  NA 136 141  K 4.4 3.5  CL 95* 106  CO2 30 24  GLUCOSE 126* 85  BUN 26* 15  CREATININE 0.80 0.83  CALCIUM 9.3 8.5*   GFR: Estimated Creatinine Clearance: 79.3 mL/min (by C-G formula based on SCr of 0.83 mg/dL). Liver Function Tests: Recent Labs  Lab 05/22/19 1643 05/24/19 0518  AST 24 20  ALT 16 13  ALKPHOS 78 54  BILITOT 1.0 0.6  PROT 6.4* 4.7*  ALBUMIN 2.3* 1.6*   No results for input(s): LIPASE, AMYLASE in the last 168 hours. No results for input(s): AMMONIA in the last 168 hours. Coagulation Profile: No results for input(s): INR, PROTIME in the last 168 hours. Cardiac Enzymes: Recent Labs  Lab 05/22/19 1643  CKTOTAL 26*   BNP (last 3 results) No results for input(s): PROBNP in the last 8760 hours. HbA1C: No results for input(s): HGBA1C in the last 72 hours. CBG: No results for input(s): GLUCAP in the last 168 hours. Lipid Profile: No results for input(s): CHOL, HDL, LDLCALC, TRIG, CHOLHDL, LDLDIRECT in  the last 72 hours. Thyroid Function Tests: No results for input(s): TSH, T4TOTAL, FREET4, T3FREE, THYROIDAB in the last 72 hours. Anemia Panel: No results for input(s): VITAMINB12, FOLATE, FERRITIN, TIBC, IRON, RETICCTPCT in the last 72 hours. Sepsis Labs: Recent Labs  Lab 05/22/19 1643 05/23/19 0103 05/23/19 2423  LATICACIDVEN 2.6* 2.2* 2.0*    Recent Results (from the past 240 hour(s))  Blood Culture (routine x 2)     Status: None (Preliminary result)   Collection Time: 05/22/19  4:45 PM   Specimen: BLOOD  Result Value Ref Range Status   Specimen Description BLOOD RIGHT ANTECUBITAL  Final   Special Requests   Final    BOTTLES DRAWN AEROBIC AND ANAEROBIC Blood Culture adequate volume   Culture   Final    NO GROWTH < 24 HOURS Performed at [facility], [address] [place], [place] [address]    Report Status PENDING  Incomplete  Urine culture     Status: Abnormal   Collection Time: 05/22/19  4:52 PM   Specimen: In/Out Cath Urine  Result Value Ref Range Status   Specimen Description IN/OUT CATH URINE  Final   Special Requests   Final    NONE Performed at [facility], [address] [place], [place] [address]    Culture >=100,000 COLONIES/mL ENTEROCOCCUS FAECALIS (A)  Final   Report Status 05/24/2019 FINAL  Final   Organism ID, Bacteria ENTEROCOCCUS FAECALIS (A)  Final      Susceptibility   Enterococcus faecalis - MIC*    AMPICILLIN <=2 SENSITIVE Sensitive     NITROFURANTOIN 32 SENSITIVE Sensitive     VANCOMYCIN 8 INTERMEDIATE Intermediate     LINEZOLID 2 SENSITIVE Sensitive     * >=100,000 COLONIES/mL ENTEROCOCCUS FAECALIS  Blood Culture (routine x 2)     Status: None (Preliminary result)   Collection Time: 05/22/19  5:33 PM   Specimen: BLOOD LEFT HAND  Result Value Ref Range Status   Specimen Description BLOOD LEFT HAND  Final   Special Requests   Final    BOTTLES DRAWN AEROBIC ONLY Blood Culture results may not be optimal due to an inadequate  volume of blood received in culture bottles   Culture   Final    NO GROWTH < 24 HOURS Performed at [facility], [address], [place], [place] [address]    Report Status PENDING  Incomplete  SARS CORONAVIRUS 2 (TAT 6-24 HRS) Nasopharyngeal Nasopharyngeal Swab     Status: None   Collection Time: 05/22/19  7:08 PM   Specimen: Nasopharyngeal  Swab  Result Value Ref Range Status   SARS Coronavirus 2 NEGATIVE NEGATIVE Final    Comment: (NOTE) SARS-CoV-2 target nucleic acids are NOT DETECTED. The SARS-CoV-2 RNA is generally detectable in upper and lower respiratory specimens during the acute phase of infection. Negative results do not preclude SARS-CoV-2 infection, do not rule out co-infections with other pathogens, and should not be used as the sole basis for treatment or other patient management decisions. Negative results must be combined with clinical observations, patient history, and epidemiological information. The expected result is Negative. Fact Sheet for Patients: HairSlick.nohttps://www.fda.gov/media/138098/download Fact Sheet for Healthcare Providers: quierodirigir.comhttps://www.fda.gov/media/138095/download This test is not yet approved or cleared by the [place] FDA and  has been authorized for detection and/or diagnosis of SARS-CoV-2 by FDA under an Emergency Use Authorization (EUA). This EUA will remain  in effect (meaning this test can be used) for the duration of the COVID-19 declaration under Section 56 4(b)(1) of the Act, 21 U.S.C. section 360bbb-3(b)(1), unless the authorization is terminated or revoked sooner. Performed at [facility], [address] [place], [place] [address]   MRSA PCR Screening     Status: None   Collection Time: 05/23/19 10:29 AM   Specimen: Nasopharyngeal  Result Value Ref Range Status   MRSA by PCR NEGATIVE NEGATIVE Final    Comment:        The GeneXpert MRSA Assay (FDA approved for NASAL specimens only), is one component of a comprehensive  MRSA colonization surveillance program. It is not intended to diagnose MRSA infection nor to guide or monitor treatment for MRSA infections. Performed at [facility], [address] [place], [place] [address]          Radiology Studies: DG Chest Port 1 View  Result Date: 05/22/2019 CLINICAL DATA:  Weakness. EXAM: PORTABLE CHEST 1 VIEW COMPARISON:  Chest radiograph 05/09/2019 FINDINGS: Redemonstrated left chest AICD device. Mild cardiomegaly is unchanged. Prior CABG. No focal consolidation within the lungs. No evidence of pleural effusion or pneumothorax. No acute bony abnormality. IMPRESSION: No evidence of acute cardiopulmonary abnormality. Stable, mild cardiomegaly. Electronically Signed   By: Jackey LogeKyle  Golden DO   On: 05/22/2019 17:12        Scheduled Meds: . allopurinol  300 mg Oral Daily  . Chlorhexidine Gluconate Cloth  6 each Topical Daily  . collagenase   Topical Daily  . dabigatran  150 mg Oral BID  . gabapentin  600 mg Oral TID  . Gerhardt's butt cream   Topical TID  . ipratropium-albuterol  3 mL Nebulization TID  . mometasone-formoterol  2 puff Inhalation BID  . pantoprazole  40 mg Oral Daily  . rosuvastatin  40 mg Oral Daily  . umeclidinium bromide  1 puff Inhalation Daily  . venlafaxine XR  150 mg Oral Q breakfast   Continuous Infusions: . sodium chloride    . cefTRIAXone (ROCEPHIN)  IV 2 g (05/23/19 1702)  . vancomycin 1,500 mg (05/24/19 [id number])     LOS: 2 days     Alwyn RenElizabeth G Carlisle Enke, MD [facility]  If 7PM-7AM, please contact night-coverage [email or web address] Password [email or web address] 05/24/2019, 8:50 AM

## 2019-05-24 NOTE — Progress Notes (Addendum)
Initial Nutrition Assessment  RD working remotely.  DOCUMENTATION CODES:   Morbid obesity  INTERVENTION:   -MVI with minerals daily -30 ml Prostat TID, each supplement provides 100 kcals and 15 grams protein  NUTRITION DIAGNOSIS:   Increased nutrient needs related to wound healing as evidenced by estimated needs.  GOAL:   Patient will meet greater than or equal to 90% of their needs  MONITOR:   PO intake, Supplement acceptance, Labs, Weight trends, Skin, I & O's  REASON FOR ASSESSMENT:   Low Braden    ASSESSMENT:   Karen Stanley is a 68 y.o. female with history of CAD status post CABG, systolic heart failure with last EF measured was 30 to 35% in November 2020 last month admitted at [facility] for CHF exacerbation eventually discharged home has been feeling weak and unable to get up from her recliner for almost 9 days now.  Patient's medication and food has been managed by her daughter.  Given that patient does have unable to ambulate EMS was called per EMS on arrival as per the report found patient to be hypotensive.  Was given fluid bolus and brought to the ER.  Pt admitted with SIRS likely from thigh cellulitis and weakness from deconditioning.   Reviewed I/O's: -40 ml x 24 hours and +410 ml since admission  UOP: 500 ml x 24 hours  Per CWOCN notes, pt with several areas of MASD and full thickness wounds to buttocks and groin.   Attempted to speak with pt via phone, however, no answer. RD unable to obtain further nutrition-related history at this time. Per H&P, pt was recently discharge from [facility] approximately 2 weeks ago and has been confined to a recliner over the past week secondary to generalized weakness.   Pt with fair appetite; noted meal completion 50%. Pt daughter was assisting with meals and medications PTA.   No weight history available to assess weight changes. Pt with mild to deep pitting edema, which may be masking true weight loss  as well as fat and muscle depletions.   Pt with increased nutritional needs related to wound healing and would greatly benefit from addition of nutritional supplements.   Labs reviewed.   Diet Order:   Diet Order            Diet Heart Room service appropriate? Yes; Fluid consistency: Thin; Fluid restriction: 1200 mL Fluid  Diet effective now              EDUCATION NEEDS:   No education needs have been identified at this time  Skin:  Skin Assessment: Skin Integrity Issues: Skin Integrity Issues:: DTI, Stage II DTI: rt thigh, rt buttocks Stage II: rt buttocks  Last BM:  05/23/19  Height:   Ht Readings from Last 1 Encounters:  05/23/19 5\' 2"  (1.575 m)    Weight:   Wt Readings from Last 1 Encounters:  05/24/19 121.1 kg    Ideal Body Weight:  50 kg  BMI:  Body mass index is 48.83 kg/m.  Estimated Nutritional Needs:   Kcal:  1800-2000  Protein:  110-125 grams  Fluid:  1.2 L    Dareon Nunziato A. Jimmye Norman, RD, LDN, [place] Registered Dietitian II Certified Diabetes Care and Education Specialist Pager: [phone] After hours Pager: [phone]

## 2019-05-25 LAB — COMPREHENSIVE METABOLIC PANEL
ALT: 12 U/L (ref 0–44)
AST: 20 U/L (ref 15–41)
Albumin: 1.7 g/dL — ABNORMAL LOW (ref 3.5–5.0)
Alkaline Phosphatase: 55 U/L (ref 38–126)
Anion gap: 7 (ref 5–15)
BUN: 18 mg/dL (ref 8–23)
CO2: 27 mmol/L (ref 22–32)
Calcium: 8.3 mg/dL — ABNORMAL LOW (ref 8.9–10.3)
Chloride: 105 mmol/L (ref 98–111)
Creatinine, Ser: 0.76 mg/dL (ref 0.44–1.00)
GFR calc Af Amer: >60 mL/min (ref >60)
GFR calc non Af Amer: >60 mL/min (ref >60)
Glucose, Bld: 95 mg/dL (ref 70–99)
Potassium: 3.7 mmol/L (ref 3.5–5.1)
Sodium: 139 mmol/L (ref 135–145)
Total Bilirubin: 0.4 mg/dL (ref 0.3–1.2)
Total Protein: 4.8 g/dL — ABNORMAL LOW (ref 6.5–8.1)

## 2019-05-25 LAB — CBC WITH DIFFERENTIAL/PLATELET
Abs Immature Granulocytes: 0.06 10*3/uL (ref 0.00–0.07)
Basophils Absolute: 0.1 10*3/uL (ref 0.0–0.1)
Basophils Relative: 1 %
Eosinophils Absolute: 0.2 10*3/uL (ref 0.0–0.5)
Eosinophils Relative: 3 %
HCT: 30 % — ABNORMAL LOW (ref 36.0–46.0)
Hemoglobin: 9.9 g/dL — ABNORMAL LOW (ref 12.0–15.0)
Immature Granulocytes: 1 %
Lymphocytes Relative: 23 %
Lymphs Abs: 1.6 10*3/uL (ref 0.7–4.0)
MCH: 33.7 pg (ref 26.0–34.0)
MCHC: 33 g/dL (ref 30.0–36.0)
MCV: 102 fL — ABNORMAL HIGH (ref 80.0–100.0)
Monocytes Absolute: 0.9 10*3/uL (ref 0.1–1.0)
Monocytes Relative: 13 %
Neutro Abs: 4.2 10*3/uL (ref 1.7–7.7)
Neutrophils Relative %: 59 %
Platelets: 243 10*3/uL (ref 150–400)
RBC: 2.94 MIL/uL — ABNORMAL LOW (ref 3.87–5.11)
RDW: 13.8 % (ref 11.5–15.5)
WBC: 7.1 10*3/uL (ref 4.0–10.5)
nRBC: 0 % (ref 0.0–0.2)

## 2019-05-25 LAB — IRON AND TIBC
Iron: 28 ug/dL (ref 28–170)
Saturation Ratios: 17 % (ref 10.4–31.8)
TIBC: 167 ug/dL — ABNORMAL LOW (ref 250–450)
UIBC: 139 ug/dL

## 2019-05-25 LAB — RETICULOCYTES
Immature Retic Fract: 19.7 % — ABNORMAL HIGH (ref 2.3–15.9)
RBC.: 2.95 MIL/uL — ABNORMAL LOW (ref 3.87–5.11)
Retic Count, Absolute: 77 10*3/uL (ref 19.0–186.0)
Retic Ct Pct: 2.6 % (ref 0.4–3.1)

## 2019-05-25 LAB — FOLATE: Folate: 2.9 ng/mL — ABNORMAL LOW (ref >5.9)

## 2019-05-25 LAB — FERRITIN: Ferritin: 458 ng/mL — ABNORMAL HIGH (ref 11–307)

## 2019-05-25 LAB — VITAMIN B12: Vitamin B-12: 349 pg/mL (ref 180–914)

## 2019-05-25 NOTE — TOC Initial Note (Signed)
Transition of Care [facility]) - Initial/Assessment Note    Patient Details  Name: Camrie Stock MRN: [id number] Date of Birth: 04/10/1951  [facility] CM/SW Contact:    Alberteen Sam, [place] Phone Number: [phone] 05/25/2019, 4:51 PM  Clinical Narrative:                  Patient agreeable to SNF recommendation, will be faxed out for bed offer.   CSW received call from patient's DSS social worker Maryann Conners at [phone] who reports she would like to be contact when facility choice is made, and kept up to date on patient's discharge.    Expected Discharge Plan: Skilled Nursing Facility Barriers to Discharge: Continued Medical Work up   Patient Goals and CMS Choice Patient states their goals for this hospitalization and ongoing recovery are:: to go to SNF CMS Medicare.gov Compare Post Acute Care list provided to:: Patient Choice offered to / list presented to : Patient  Expected Discharge Plan and Services Expected Discharge Plan: [place] Choice: [place] arrangements for the past 2 months: Single Family Home                                      Prior Living Arrangements/Services Living arrangements for the past 2 months: Single Family Home Lives with:: Spouse Patient language and need for interpreter reviewed:: Yes Do you feel safe going back to the place where you live?: Yes      Need for Family Participation in Patient Care: Yes (Comment) Care giver support system in place?: Yes (comment)   Criminal Activity/Legal Involvement Pertinent to Current Situation/Hospitalization: No - Comment as needed  Activities of Daily Living Home Assistive Devices/Equipment: Eyeglasses, Dentures (specify type), Walker (specify type) ADL Screening (condition at time of admission) Patient's cognitive ability adequate to safely complete daily activities?: Yes Is the patient deaf or have difficulty hearing?:  No Does the patient have difficulty seeing, even when wearing glasses/contacts?: No Does the patient have difficulty concentrating, remembering, or making decisions?: No Patient able to express need for assistance with ADLs?: Yes Does the patient have difficulty dressing or bathing?: Yes Independently performs ADLs?: No Communication: Independent Dressing (OT): Needs assistance Is this a change from baseline?: Change from baseline, expected to last >3 days Grooming: Needs assistance Is this a change from baseline?: Change from baseline, expected to last >3 days Feeding: Needs assistance Is this a change from baseline?: Change from baseline, expected to last <3 days Bathing: Dependent Is this a change from baseline?: Change from baseline, expected to last >3 days Toileting: Dependent Is this a change from baseline?: Change from baseline, expected to last >3days In/Out Bed: Needs assistance Is this a change from baseline?: Change from baseline, expected to last >3 days Walks in Home: Needs assistance Is this a change from baseline?: Change from baseline, expected to last >3 days Does the patient have difficulty walking or climbing stairs?: Yes Weakness of Legs: Both Weakness of Arms/Hands: Both  Permission Sought/Granted Permission sought to share information with : Case Manager, Customer service manager, Family Supports Permission granted to share information with : Yes, Verbal Permission Granted  Share Information with NAME: Maurianna  Permission granted to share info w AGENCY: SNFs  Permission granted to share info w Relationship: daughter  Permission granted to share info w Contact Information: [phone]  Emotional  Assessment Appearance:: Appears stated age Attitude/Demeanor/Rapport: Gracious Affect (typically observed): Calm Orientation: : Oriented to Self, Oriented to Place, Oriented to  Time, Oriented to Situation Alcohol / Substance Use: Not Applicable Psych  Involvement: No (comment)  Admission diagnosis:  Dehydration [E86.0] Weakness [R53.1] SIRS (systemic inflammatory response syndrome) (HCC) [R65.10] Cellulitis, unspecified cellulitis site [L03.90] Patient Active Problem List   Diagnosis Date Noted  . Pressure injury of skin 05/23/2019  . SIRS (systemic inflammatory response syndrome) (HCC) 05/22/2019  . PAF (paroxysmal atrial fibrillation) (HCC) 05/22/2019  . Cellulitis 05/22/2019  . Cardiomyopathy (HCC) 30-35 % EF 11/20 05/22/2019  . HYPERCHOLESTEROLEMIA 02/26/2010  . TOBACCO ABUSE 02/26/2010  . Essential hypertension 02/26/2010  . Coronary atherosclerosis 02/26/2010  . EMPHYSEMA 02/26/2010  . COPD 02/26/2010  . GERD 02/26/2010   PCP:  System, Pcp Not In Pharmacy:   [facility] - [place], [place] - [address] [place] [address] Phone: [phone] Fax: [phone]     Social Determinants of Health (SDOH) Interventions    Readmission Risk Interventions No flowsheet data found.

## 2019-05-25 NOTE — NC FL2 (Addendum)
[place] LEVEL OF CARE SCREENING TOOL     IDENTIFICATION  Patient Name: Karen Stanley Birthdate: 1950/11/18 Sex: female Admission Date (Current Location): 05/22/2019  [facility] and [place] Number:  Herbalist and Address:  [facility], [place] [address] [place], [place] [address]      Provider Number: [id number]  Attending Physician Name and Address:  Georgette Shell, MD  Relative Name and Phone Number:       Current Level of Care: Hospital Recommended Level of Care: [place] Prior Approval Number:    Date Approved/Denied: 05/25/19 PASRR Number: [id number]  Discharge Plan: SNF    Current Diagnoses: Patient Active Problem List   Diagnosis Date Noted   Pressure injury of skin 05/23/2019   SIRS (systemic inflammatory response syndrome) ([place]) 05/22/2019   PAF (paroxysmal atrial fibrillation) ([place]) 05/22/2019   Cellulitis 05/22/2019   Cardiomyopathy ([place]) 30-35 % EF 11/20 05/22/2019   HYPERCHOLESTEROLEMIA 02/26/2010   TOBACCO ABUSE 02/26/2010   Essential hypertension 02/26/2010   Coronary atherosclerosis 02/26/2010   EMPHYSEMA 02/26/2010   COPD 02/26/2010   GERD 02/26/2010    Orientation RESPIRATION BLADDER Height & Weight     Self, Time, Situation, Place  Normal Incontinent Weight: 115.2 kg Height:  5\' 2"  (157.5 cm)  BEHAVIORAL SYMPTOMS/MOOD NEUROLOGICAL BOWEL NUTRITION STATUS      Continent Diet(1200 L fluid restriction)  AMBULATORY STATUS COMMUNICATION OF NEEDS Skin   Extensive Assist Verbally Bruising, Other (Comment)(cellulitis to groin, bruising to hand/ arm)                       Personal Care Assistance Level of Assistance  Bathing, Feeding, Dressing Bathing Assistance: Maximum assistance Feeding assistance: Maximum assistance Dressing Assistance: Maximum assistance     Functional Limitations Info  Sight, Hearing, Speech Sight Info: Adequate Hearing Info: Adequate Speech Info:  Adequate    SPECIAL CARE FACTORS FREQUENCY  PT (By licensed PT), OT (By licensed OT)     PT Frequency: 5X week OT Frequency: 5X week            Contractures Contractures Info: Not present    Additional Factors Info  Code Status Code Status Info: DNR             Current Medications (05/25/2019):  This is the current hospital active medication list Current Facility-Administered Medications  Medication Dose Route Frequency Provider Last Rate Last Admin   0.9 %  sodium chloride infusion   Intravenous PRN Rise Patience, MD 10 mL/hr at 05/25/19 1409 1,000 mL at 05/25/19 1409   acetaminophen (TYLENOL) tablet 650 mg  650 mg Oral Q6H PRN Rise Patience, MD   650 mg at 05/23/19 1701   Or   acetaminophen (TYLENOL) suppository 650 mg  650 mg Rectal Q6H PRN Rise Patience, MD       allopurinol (ZYLOPRIM) tablet 300 mg  300 mg Oral Daily Rise Patience, MD   300 mg at 05/25/19 0938   ceFAZolin (ANCEF) IVPB 2g/100 mL premix  2 g Intravenous Q8H Georgette Shell, MD 200 mL/hr at 05/25/19 1410 2 g at 05/25/19 1410   Chlorhexidine Gluconate Cloth 2 % PADS 6 each  6 each Topical Daily Georgette Shell, MD   6 each at 05/25/19 1829   collagenase (SANTYL) ointment   Topical Daily Georgette Shell, MD   Given at 05/25/19 [phone]   dabigatran (PRADAXA) capsule 150 mg  150 mg  Oral BID Eduard Clos, MD   150 mg at 05/25/19 1610   feeding supplement (PRO-STAT SUGAR FREE 64) liquid 30 mL  30 mL Oral TID Alwyn Ren, MD   30 mL at 05/25/19 1523   gabapentin (NEURONTIN) tablet 600 mg  600 mg Oral TID Eduard Clos, MD   600 mg at 05/25/19 1523   Gerhardt's butt cream   Topical TID Alwyn Ren, MD   Given at 05/25/19 1421   ipratropium-albuterol (DUONEB) 0.5-2.5 (3) MG/3ML nebulizer solution 3 mL  3 mL Nebulization TID Alwyn Ren, MD   3 mL at 05/25/19 1430   mometasone-formoterol (DULERA) 100-5 MCG/ACT inhaler 2 puff  2 puff  Inhalation BID Alwyn Ren, MD   2 puff at 05/25/19 9604   multivitamin with minerals tablet 1 tablet  1 tablet Oral Daily Alwyn Ren, MD   1 tablet at 05/25/19 0824   ondansetron [facility]) tablet 4 mg  4 mg Oral Q6H PRN Eduard Clos, MD       Or   ondansetron [facility]) injection 4 mg  4 mg Intravenous Q6H PRN Eduard Clos, MD       pantoprazole (PROTONIX) EC tablet 40 mg  40 mg Oral Daily Eduard Clos, MD   40 mg at 05/25/19 5409   rosuvastatin (CRESTOR) tablet 40 mg  40 mg Oral Daily Eduard Clos, MD   40 mg at 05/25/19 8119   umeclidinium bromide (INCRUSE ELLIPTA) 62.5 MCG/INH 1 puff  1 puff Inhalation Daily Alwyn Ren, MD   1 puff at 05/25/19 0753   venlafaxine XR (EFFEXOR-XR) 24 hr capsule 150 mg  150 mg Oral Q breakfast Eduard Clos, MD   150 mg at 05/25/19 1478     Discharge Medications: Please see discharge summary for a list of discharge medications.  Relevant Imaging Results:  Relevant Lab Results:   Additional Information [id number]  Lawerance Sabal, RN     Medical screening and documentation was performed by qualified clinical staff member and as supervising physician I was immediately available for consultation/collaboration. I have reviewed documentation and agree with assessment and plan.  Sunnie Nielsen, DO 05/30/19 12:03 PM

## 2019-05-25 NOTE — Progress Notes (Signed)
PROGRESS NOTE    Karen Stanley  YWV:[id number] DOB: [birth year] DOA: 05/22/2019 PCP: System, Pcp Not In  Brief Narrative:  68 y.o.femalewithhistory of CAD status post CABG, systolic heart failure with last EF measured was 30 to 35% in November 2020 last month admitted at [facility] for CHF exacerbation eventually discharged home has been feeling weak and unable to get up from her recliner for almost 9 days now. Patient's medication and food has been managed by her daughter. Given that patient does have unable to ambulate EMS was called per EMS on arrival as per the report found patient to be hypotensive. Was given fluid bolus and brought to the ER.  ED Course:As ordered a 1 L bolus was given for which blood pressure improved. Lactate was elevated at 2.6 which improved to 2.2. WBC count was 12.3. Chest x-ray unremarkable Covid test was negative. Given the hypotension and elevated lactate with WBC count concern for possible sepsis. UA is unremarkable but on exam patient has cellulitis of the thigh area mostly in the medial aspect and some wounds on the buttock area. Patient admitted for further management of SIRS likely from cellulitis and may need rehab.  Assessment & Plan:   Principal Problem:   SIRS (systemic inflammatory response syndrome) (HCC) Active Problems:   Essential hypertension   Coronary atherosclerosis   PAF (paroxysmal atrial fibrillation) (HCC)   Cellulitis   Cardiomyopathy ([place]) 30-35 % EF 11/20   Pressure injury of skin  #1 cellulitis of the left inner thigh patient admitted with increasing lower extremity edema and left lower extremity erythema leukocytosis hypotension and lactic acid elevation. She was given IV fluids and started on Vanco and Rocephin. Lactate level trending down from 2.6 at the time of admission to 2.2 to2.0 today. MRSA PCR negative.  DC vancomycin. BP still soft 91/55. Continue to monitor off IV fluids. She already has pitting  edema to lower extremities.  #2 chronic systolic heart failure ejection fraction 30 to 35%-continue to hold Lasix.  She is not in any respiratory distress at this time.  She is on 2 L of oxygen which she was on prior to admission.  Lasix is currently on hold due to soft blood pressure.  Reevaluate daily to determine when to restart Lasix.   She reports she was on Spiriva, Dulera, and nebulizers every 4 prior to admission though this is not in the med list.  #3 A. fib on Pradaxa continue.  Rate controlled.  Not on any medications for rate control.  #4 CAD/hyperlipidemia continue statins and Pradaxa. She is on Imdur which is on hold due to soft BP.  #5 history of gout on allopurinol.  #6 severe deconditioning and debility patient seen by physical therapy and recommends SNF.   #7 moisture associated skin damage to the buttocks and groin-appreciate wound care input. Patient reports she was on diaper for multiple days and was sitting in feces and urine for long periods of time. Topical treatments per wound care nurse. Air mattress ordered. But cream to [place] moisture and promote healing and Santyl for chemical debridements to wounds with slough's on the buttocks.  #8 hypotension her blood pressure today is 99/83 which is improved compared to yesterday.  She has been on Lasix at home which has been on hold due to this reason.  Reevaluate daily to see when she will be able to restart Lasix safely.   Pressure Injury 05/23/19 Buttocks Right Stage 2 -  Partial thickness loss of dermis presenting  as a shallow open injury with a red, pink wound bed without slough. these are full thickness wounds across bilat buttocks related to moisture, not pressure (Active)  05/23/19 0016  Location: Buttocks  Location Orientation: Right  Staging: Stage 2 -  Partial thickness loss of dermis presenting as a shallow open injury with a red, pink wound bed without slough.  Wound Description (Comments): these are  full thickness wounds across bilat buttocks related to moisture, not pressure  Present on Admission: Yes     Pressure Injury 05/23/19 Buttocks Right;Left Deep Tissue Pressure Injury - Purple or maroon localized area of discolored intact skin or blood-filled blister due to damage of underlying soft tissue from pressure and/or shear. deep tissue pressure injuries (Active)  05/23/19   Location: Buttocks  Location Orientation: Right;Left  Staging: Deep Tissue Pressure Injury - Purple or maroon localized area of discolored intact skin or blood-filled blister due to damage of underlying soft tissue from pressure and/or shear.  Wound Description (Comments): deep tissue pressure injuries where elastic from briefs was located prior to admission across bilat thighs, posterior back, buttocks  Present on Admission: Yes      Nutrition Problem: Increased nutrient needs Etiology: wound healing     Signs/Symptoms: estimated needs    Interventions: MVI, Prostat  Estimated body mass index is 46.46 kg/m as calculated from the following:   Height as of this encounter: 5\' 2"  (1.575 m).   Weight as of this encounter: 115.2 kg. DVT prophylaxis:Pradaxa Code Status:DNR Family Communication:Attempted to call the daughter with no response Disposition Plan:Pending clinical improvement /PT recommends SNF.toc consulted. She will need a Covid ordered prior to discharge to SNF. Consultants:  None  Procedures:None Antimicrobials:ancef   Subjective:  Patient is resting in bed she reports having a good night breathing is better seen by physical therapy recommending SNF and she is aware of it and she is agreeable to go to rehab prior to going home. Objective: Vitals:   05/25/19 0355 05/25/19 0358 05/25/19 0754 05/25/19 0822  BP: 111/65   99/83  Pulse: 95  92 98  Resp: 20  16   Temp: 98.3 F (36.8 C)     TempSrc: Oral     SpO2: 96%  98%   Weight:  115.2 kg    Height:         Intake/Output Summary (Last 24 hours) at 05/25/2019 0854 Last data filed at 05/25/2019 0600 Gross per 24 hour  Intake 1046.32 ml  Output 1000 ml  Net 46.32 ml   Filed Weights   05/23/19 0016 05/24/19 0021 05/25/19 0358  Weight: 124.6 kg 121.1 kg 115.2 kg    Examination:  General exam: Appears calm and comfortable  Respiratory system diminished breath sounds at the bases to auscultation. Respiratory effort normal. Cardiovascular system: S1 & S2 heard, RRR. No JVD, murmurs, rubs, gallops or clicks. No pedal edema. Gastrointestinal system: Abdomen is nondistended, soft and nontender. No organomegaly or masses felt. Normal bowel sounds heard. Central nervous system: Alert and oriented. No focal neurological deficits. Extremities: Trace pitting edema bilaterally  Psychiatry: Judgement and insight appear normal. Mood & affect appropriate.     Data Reviewed: I have personally reviewed following labs and imaging studies  CBC: Recent Labs  Lab 05/22/19 1643 05/24/19 0518 05/25/19 0437  WBC 12.3* 6.3 7.1  NEUTROABS 8.9* 3.9 4.2  HGB 13.5 9.6* 9.9*  HCT 41.1 30.0* 30.0*  MCV 102.8* 103.1* 102.0*  PLT 333 236 243   Basic Metabolic Panel: Recent  Labs  Lab 05/22/19 1643 05/24/19 0518 05/25/19 0437  NA 136 141 139  K 4.4 3.5 3.7  CL 95* 106 105  CO2 30 24 27   GLUCOSE 126* 85 95  BUN 26* 15 18  CREATININE 0.80 0.83 0.76  CALCIUM 9.3 8.5* 8.3*   GFR: Estimated Creatinine Clearance: 79.7 mL/min (by C-G formula based on SCr of 0.76 mg/dL). Liver Function Tests: Recent Labs  Lab 05/22/19 1643 05/24/19 0518 05/25/19 0437  AST 24 20 20   ALT 16 13 12   ALKPHOS 78 54 55  BILITOT 1.0 0.6 0.4  PROT 6.4* 4.7* 4.8*  ALBUMIN 2.3* 1.6* 1.7*   No results for input(s): LIPASE, AMYLASE in the last 168 hours. No results for input(s): AMMONIA in the last 168 hours. Coagulation Profile: No results for input(s): INR, PROTIME in the last 168 hours. Cardiac Enzymes: Recent  Labs  Lab 05/22/19 1643  CKTOTAL 26*   BNP (last 3 results) No results for input(s): PROBNP in the last 8760 hours. HbA1C: No results for input(s): HGBA1C in the last 72 hours. CBG: No results for input(s): GLUCAP in the last 168 hours. Lipid Profile: No results for input(s): CHOL, HDL, LDLCALC, TRIG, CHOLHDL, LDLDIRECT in the last 72 hours. Thyroid Function Tests: No results for input(s): TSH, T4TOTAL, FREET4, T3FREE, THYROIDAB in the last 72 hours. Anemia Panel: Recent Labs    05/25/19 0437  VITAMINB12 349  FOLATE 2.9*  FERRITIN 458*  TIBC 167*  IRON 28  RETICCTPCT 2.6   Sepsis Labs: Recent Labs  Lab 05/22/19 1643 05/23/19 0103 05/23/19 0605  LATICACIDVEN 2.6* 2.2* 2.0*    Recent Results (from the past 240 hour(s))  Blood Culture (routine x 2)     Status: None (Preliminary result)   Collection Time: 05/22/19  4:45 PM   Specimen: BLOOD  Result Value Ref Range Status   Specimen Description BLOOD RIGHT ANTECUBITAL  Final   Special Requests   Final    BOTTLES DRAWN AEROBIC AND ANAEROBIC Blood Culture adequate volume   Culture   Final    NO GROWTH 2 DAYS Performed at [facility], [address] [place], [place] [address]    Report Status PENDING  Incomplete  Urine culture     Status: Abnormal   Collection Time: 05/22/19  4:52 PM   Specimen: In/Out Cath Urine  Result Value Ref Range Status   Specimen Description IN/OUT CATH URINE  Final   Special Requests   Final    NONE Performed at [facility], [address] [place], [place] [address]    Culture >=100,000 COLONIES/mL ENTEROCOCCUS FAECALIS (A)  Final   Report Status 05/24/2019 FINAL  Final   Organism ID, Bacteria ENTEROCOCCUS FAECALIS (A)  Final      Susceptibility   Enterococcus faecalis - MIC*    AMPICILLIN <=2 SENSITIVE Sensitive     NITROFURANTOIN 32 SENSITIVE Sensitive     VANCOMYCIN 8 INTERMEDIATE Intermediate     LINEZOLID 2 SENSITIVE Sensitive     * >=100,000 COLONIES/mL  ENTEROCOCCUS FAECALIS  Blood Culture (routine x 2)     Status: None (Preliminary result)   Collection Time: 05/22/19  5:33 PM   Specimen: BLOOD LEFT HAND  Result Value Ref Range Status   Specimen Description BLOOD LEFT HAND  Final   Special Requests   Final    BOTTLES DRAWN AEROBIC ONLY Blood Culture results may not be optimal due to an inadequate volume of blood received in culture bottles  Culture   Final    NO GROWTH 2 DAYS Performed at [facility], [address] [place], [place] [address]    Report Status PENDING  Incomplete  SARS CORONAVIRUS 2 (TAT 6-24 HRS) Nasopharyngeal Nasopharyngeal Swab     Status: None   Collection Time: 05/22/19  7:08 PM   Specimen: Nasopharyngeal Swab  Result Value Ref Range Status   SARS Coronavirus 2 NEGATIVE NEGATIVE Final    Comment: (NOTE) SARS-CoV-2 target nucleic acids are NOT DETECTED. The SARS-CoV-2 RNA is generally detectable in upper and lower respiratory specimens during the acute phase of infection. Negative results do not preclude SARS-CoV-2 infection, do not rule out co-infections with other pathogens, and should not be used as the sole basis for treatment or other patient management decisions. Negative results must be combined with clinical observations, patient history, and epidemiological information. The expected result is Negative. Fact Sheet for Patients: HairSlick.no Fact Sheet for Healthcare Providers: quierodirigir.com This test is not yet approved or cleared by the [place] FDA and  has been authorized for detection and/or diagnosis of SARS-CoV-2 by FDA under an Emergency Use Authorization (EUA). This EUA will remain  in effect (meaning this test can be used) for the duration of the COVID-19 declaration under Section 56 4(b)(1) of the Act, 21 U.S.C. section 360bbb-3(b)(1), unless the authorization is terminated or revoked sooner. Performed at [facility], [address] [place], [place] [address]   MRSA PCR Screening     Status: None   Collection Time: 05/23/19 10:29 AM   Specimen: Nasopharyngeal  Result Value Ref Range Status   MRSA by PCR NEGATIVE NEGATIVE Final    Comment:        The GeneXpert MRSA Assay (FDA approved for NASAL specimens only), is one component of a comprehensive MRSA colonization surveillance program. It is not intended to diagnose MRSA infection nor to guide or monitor treatment for MRSA infections. Performed at [facility], [address], [place], [place] [address]          Radiology Studies: No results found.      Scheduled Meds: . allopurinol  300 mg Oral Daily  . Chlorhexidine Gluconate Cloth  6 each Topical Daily  . collagenase   Topical Daily  . dabigatran  150 mg Oral BID  . feeding supplement (PRO-STAT SUGAR FREE 64)  30 mL Oral TID  . gabapentin  600 mg Oral TID  . Gerhardt's butt cream   Topical TID  . ipratropium-albuterol  3 mL Nebulization TID  . mometasone-formoterol  2 puff Inhalation BID  . multivitamin with minerals  1 tablet Oral Daily  . pantoprazole  40 mg Oral Daily  . rosuvastatin  40 mg Oral Daily  . umeclidinium bromide  1 puff Inhalation Daily  . venlafaxine XR  150 mg Oral Q breakfast   Continuous Infusions: . sodium chloride Stopped (05/25/19 0553)  .  ceFAZolin (ANCEF) IV 2 g (05/25/19 0553)     LOS: 3 days     Alwyn Ren, MD [facility]  If 7PM-7AM, please contact night-coverage [email or web address] Password [facility] 05/25/2019, 8:54 AM

## 2019-05-26 ENCOUNTER — Encounter (HOSPITAL_COMMUNITY): Payer: Self-pay | Admitting: Internal Medicine

## 2019-05-26 DIAGNOSIS — R6510 Systemic inflammatory response syndrome (SIRS) of non-infectious origin without acute organ dysfunction: Secondary | ICD-10-CM

## 2019-05-26 LAB — CBC WITH DIFFERENTIAL/PLATELET
Abs Immature Granulocytes: 0.06 10*3/uL (ref 0.00–0.07)
Basophils Absolute: 0.1 10*3/uL (ref 0.0–0.1)
Basophils Relative: 1 %
Eosinophils Absolute: 0.2 10*3/uL (ref 0.0–0.5)
Eosinophils Relative: 3 %
HCT: 30.2 % — ABNORMAL LOW (ref 36.0–46.0)
Hemoglobin: 9.5 g/dL — ABNORMAL LOW (ref 12.0–15.0)
Immature Granulocytes: 1 %
Lymphocytes Relative: 30 %
Lymphs Abs: 1.7 10*3/uL (ref 0.7–4.0)
MCH: 33.1 pg (ref 26.0–34.0)
MCHC: 31.5 g/dL (ref 30.0–36.0)
MCV: 105.2 fL — ABNORMAL HIGH (ref 80.0–100.0)
Monocytes Absolute: 0.7 10*3/uL (ref 0.1–1.0)
Monocytes Relative: 13 %
Neutro Abs: 3.1 10*3/uL (ref 1.7–7.7)
Neutrophils Relative %: 52 %
Platelets: 234 10*3/uL (ref 150–400)
RBC: 2.87 MIL/uL — ABNORMAL LOW (ref 3.87–5.11)
RDW: 13.9 % (ref 11.5–15.5)
WBC: 5.8 10*3/uL (ref 4.0–10.5)
nRBC: 0 % (ref 0.0–0.2)

## 2019-05-26 LAB — COMPREHENSIVE METABOLIC PANEL
ALT: 12 U/L (ref 0–44)
AST: 24 U/L (ref 15–41)
Albumin: 1.7 g/dL — ABNORMAL LOW (ref 3.5–5.0)
Alkaline Phosphatase: 56 U/L (ref 38–126)
Anion gap: 9 (ref 5–15)
BUN: 18 mg/dL (ref 8–23)
CO2: 27 mmol/L (ref 22–32)
Calcium: 8.2 mg/dL — ABNORMAL LOW (ref 8.9–10.3)
Chloride: 106 mmol/L (ref 98–111)
Creatinine, Ser: 0.6 mg/dL (ref 0.44–1.00)
GFR calc Af Amer: >60 mL/min (ref >60)
GFR calc non Af Amer: >60 mL/min (ref >60)
Glucose, Bld: 90 mg/dL (ref 70–99)
Potassium: 4.3 mmol/L (ref 3.5–5.1)
Sodium: 142 mmol/L (ref 135–145)
Total Bilirubin: 0.6 mg/dL (ref 0.3–1.2)
Total Protein: 4.4 g/dL — ABNORMAL LOW (ref 6.5–8.1)

## 2019-05-26 MED ORDER — IPRATROPIUM-ALBUTEROL 0.5-2.5 (3) MG/3ML IN SOLN
3.0000 mL | Freq: Two times a day (BID) | RESPIRATORY_TRACT | Status: DC
  Administered 2019-05-26 – 2019-05-29 (×6): 3 mL via RESPIRATORY_TRACT
  Filled 2019-05-26 (×6): qty 3

## 2019-05-26 MED ORDER — ISOSORBIDE MONONITRATE ER 30 MG PO TB24
30.0000 mg | ORAL_TABLET | Freq: Every day | ORAL | Status: DC
  Administered 2019-05-26 – 2019-05-29 (×4): 30 mg via ORAL
  Filled 2019-05-26 (×4): qty 1

## 2019-05-26 MED ORDER — FUROSEMIDE 40 MG PO TABS
40.0000 mg | ORAL_TABLET | Freq: Every day | ORAL | Status: DC
  Administered 2019-05-26 – 2019-05-29 (×4): 40 mg via ORAL
  Filled 2019-05-26 (×4): qty 1

## 2019-05-26 NOTE — Progress Notes (Signed)
Pt vitals upon assessment showed BP 80/60, MAP 67. Retaken : BP 122/99, MAP 108. Pt asymptomatic and stated her BP fluctuates often. MD paged. Will continue to monitor.

## 2019-05-26 NOTE — Progress Notes (Addendum)
PROGRESS NOTE    Karen Stanley  HDQ:[id number] DOB: 1951/01/27 DOA: 05/22/2019 PCP: System, Pcp Not In:    Brief Narrative:   Admitted 05/22/19 for SIRS/cellulitis L thigh and hypotension, started Vanc/Rocephin and folloing, Cx, Latate, Procalcitonin. PT consulted, Afib on Pradaxa, ?beta blocker. CHF, lastix held d/t hypotension  05/23/19 lactate trending down, still holding Lasix and beta blockers d/t hypotension, PT recommended SNF   05/24/19 Lactate continues to trend down, Vanc D/C MRSA Neg, still holding Lasix and beta blocker   05/25/19 hypotension somewhat improved. Awaiting SNF offer   Today 05/26/19 stable and blood Cx negative so far, we are not doing much else, hopefully can go tomorrow pending SNF offer. Ucx positive enterococcus. BP up today systolic 119'E-174'Y, restarted Lasix and will monitor BP, might add ACEI/BB as well if BP tolerates. Nurese reported pt c/o CP which resolved w/ meal, pt concerned for heartburn, EKG reviewed and no ST changes c/w ACS but bigeminy is noted and frequent PVC< wide QRS which was present on previous EKG, can follow symptoms and PCP should repeat EKG on follow-up    Assessment & Plan:   Principal Problem:   SIRS (systemic inflammatory response syndrome) (HCC) Active Problems:   Essential hypertension   Coronary atherosclerosis   PAF (paroxysmal atrial fibrillation) (HCC)   Cellulitis   Cardiomyopathy (HCC) 30-35 % EF 11/20   Pressure injury of skin  Cellulitis  of the left inner thigh patient admitted with increasing lower extremity edema and left lower extremity erythema leukocytosis hypotension and lactic acid elevation. BP improved Continue Abx w/ Ancef Continue to monitor off IV fluids. She already has pitting edema to lower extremities.  UTI Consulted w/ pharmacy, if asymptomatic bacteriuria nothing to do.   chronic systolic heart failure ejection fraction 30 to 35% BP better, restarted Lasix She is on 2 L of oxygen which  she was on prior to admission.   COPD She reports she was on Spiriva, Dulera, and nebulizers every 4 prior to admission though this is not in the med list.  Paroxysmal A. fib  on Pradaxa continue. Rate controlled. Not on any medications for rate control. Sinus rhythm on EKG today w/ bigeminy/PVC  CAD/hyperlipidemia  continue statins and Pradaxa.  She is on Imdur which was on hold due to soft BP but I restarted this today   history of gout  on allopurinol.  severe deconditioning and debility patient seen by physical therapy and recommends SNF.  moisture associated skin damage to the buttocks and groin appreciate wound care input. Patient reports she was on diaper for multiple days and was sitting in feces and urine for long periods of time. Topical treatments per wound care nurse. Air mattress ordered. But cream to Ripple moisture and promote healing and Santyl for chemical debridements to wounds with slough's on the buttocks.     Pressure Injury 05/23/19 Buttocks Right Stage 2 -  Partial thickness loss of dermis presenting as a shallow open injury with a red, pink wound bed without slough. these are full thickness wounds across bilat buttocks related to moisture, not pressure (Active)  05/23/19 0016  Location: Buttocks  Location Orientation: Right  Staging: Stage 2 -  Partial thickness loss of dermis presenting as a shallow open injury with a red, pink wound bed without slough.  Wound Description (Comments): these are full thickness wounds across bilat buttocks related to moisture, not pressure  Present on Admission: Yes       DVT prophylaxis: pt on Pradaxa outpatient,  this was continued  Code Status: DNR Family Communication: nothing today Disposition Plan: awaiting SNF offer   Consultants:  PT   Procedures:  None  Antimicrobials: Anti-infectives (From admission, onward)   Start     Dose/Rate Route Frequency Ordered Stop   05/24/19 1400   ceFAZolin (ANCEF) IVPB 2g/100 mL premix     2 g 200 mL/hr over 30 Minutes Intravenous Every 8 hours 05/24/19 1022     05/24/19 0900  vancomycin (VANCOREADY) IVPB 1500 mg/300 mL  Status:  Discontinued     1,500 mg 150 mL/hr over 120 Minutes Intravenous Every 24 hours 05/23/19 0828 05/24/19 1022   05/23/19 0900  vancomycin (VANCOCIN) 2,500 mg in sodium chloride 0.9 % 500 mL IVPB     2,500 mg 250 mL/hr over 120 Minutes Intravenous NOW 05/23/19 0828 05/23/19 1514   05/22/19 1700  cefTRIAXone (ROCEPHIN) 2 g in sodium chloride 0.9 % 100 mL IVPB  Status:  Discontinued     2 g 200 mL/hr over 30 Minutes Intravenous Every 24 hours 05/22/19 1639 05/24/19 1022       Subjective: Pt feeling well today, no concerns.   Objective: Vitals:   05/26/19 0754 05/26/19 0818 05/26/19 1140 05/26/19 1219  BP:  98/61 (!) 172/109 (!) 144/74  Pulse: 88 92 98 70  Resp: 18 18 20    Temp:  98.4 F (36.9 C) 97.6 F (36.4 C)   TempSrc:  Oral Oral   SpO2: 96% 97% 96%   Weight:      Height:        Intake/Output Summary (Last 24 hours) at 05/26/2019 1256 Last data filed at 05/26/2019 0900 Gross per 24 hour  Intake 780 ml  Output 1550 ml  Net -770 ml   Filed Weights   05/24/19 0021 05/25/19 0358 05/26/19 0521  Weight: 121.1 kg 115.2 kg 116.2 kg    Examination:  General exam: Appears calm and comfortable  Respiratory system: Clear to auscultation. Respiratory effort normal. Cardiovascular system: S1 & S2 heard, RRR. No JVD, murmurs, rubs, gallops or clicks. No pedal edema. Gastrointestinal system: Abdomen is nondistended, soft and nontender. No organomegaly or masses felt. Normal bowel sounds heard. Central nervous system: Alert and oriented. No focal neurological deficits. Extremities: Symmetric 5 x 5 power. Skin: No rashes, lesions or ulcers Psychiatry: Judgement and insight appear normal. Mood & affect appropriate.     Data Reviewed: I have personally reviewed following labs and imaging  studies  CBC: Recent Labs  Lab 05/22/19 1643 05/24/19 0518 05/25/19 0437 05/26/19 0527  WBC 12.3* 6.3 7.1 5.8  NEUTROABS 8.9* 3.9 4.2 3.1  HGB 13.5 9.6* 9.9* 9.5*  HCT 41.1 30.0* 30.0* 30.2*  MCV 102.8* 103.1* 102.0* 105.2*  PLT 333 236 243 234   Basic Metabolic Panel: Recent Labs  Lab 05/22/19 1643 05/24/19 0518 05/25/19 0437 05/26/19 0527  NA 136 141 139 142  K 4.4 3.5 3.7 4.3  CL 95* 106 105 106  CO2 30 24 27 27   GLUCOSE 126* 85 95 90  BUN 26* 15 18 18   CREATININE 0.80 0.83 0.76 0.60  CALCIUM 9.3 8.5* 8.3* 8.2*   GFR: Estimated Creatinine Clearance: 80.2 mL/min (by C-G formula based on SCr of 0.6 mg/dL). Liver Function Tests: Recent Labs  Lab 05/22/19 1643 05/24/19 0518 05/25/19 0437 05/26/19 0527  AST 24 20 20 24   ALT 16 13 12 12   ALKPHOS 78 54 55 56  BILITOT 1.0 0.6 0.4 0.6  PROT 6.4* 4.7* 4.8* 4.4*  ALBUMIN  2.3* 1.6* 1.7* 1.7*   No results for input(s): LIPASE, AMYLASE in the last 168 hours. No results for input(s): AMMONIA in the last 168 hours. Coagulation Profile: No results for input(s): INR, PROTIME in the last 168 hours. Cardiac Enzymes: Recent Labs  Lab 05/22/19 1643  CKTOTAL 26*   BNP (last 3 results) No results for input(s): PROBNP in the last 8760 hours. HbA1C: No results for input(s): HGBA1C in the last 72 hours. CBG: No results for input(s): GLUCAP in the last 168 hours. Lipid Profile: No results for input(s): CHOL, HDL, LDLCALC, TRIG, CHOLHDL, LDLDIRECT in the last 72 hours. Thyroid Function Tests: No results for input(s): TSH, T4TOTAL, FREET4, T3FREE, THYROIDAB in the last 72 hours. Anemia Panel: Recent Labs    05/25/19 0437  VITAMINB12 349  FOLATE 2.9*  FERRITIN 458*  TIBC 167*  IRON 28  RETICCTPCT 2.6   Urine analysis:    Component Value Date/Time   COLORURINE YELLOW 05/22/2019 1652   APPEARANCEUR CLEAR 05/22/2019 1652   LABSPEC 1.023 05/22/2019 1652   PHURINE 5.0 05/22/2019 1652   GLUCOSEU NEGATIVE 05/22/2019  1652   HGBUR NEGATIVE 05/22/2019 1652   BILIRUBINUR NEGATIVE 05/22/2019 1652   KETONESUR NEGATIVE 05/22/2019 1652   PROTEINUR NEGATIVE 05/22/2019 1652   UROBILINOGEN 0.2 02/03/2010 2339   NITRITE NEGATIVE 05/22/2019 1652   LEUKOCYTESUR NEGATIVE 05/22/2019 1652   Sepsis Labs: @LABRCNTIP (procalcitonin:4,lacticidven:4)  ) Recent Results (from the past 240 hour(s))  Blood Culture (routine x 2)     Status: None (Preliminary result)   Collection Time: 05/22/19  4:45 PM   Specimen: BLOOD  Result Value Ref Range Status   Specimen Description BLOOD RIGHT ANTECUBITAL  Final   Special Requests   Final    BOTTLES DRAWN AEROBIC AND ANAEROBIC Blood Culture adequate volume   Culture   Final    NO GROWTH 3 DAYS Performed at [facility], [address], [place], [place]    Report Status PENDING  Incomplete  Urine culture     Status: Abnormal   Collection Time: 05/22/19  4:52 PM   Specimen: In/Out Cath Urine  Result Value Ref Range Status   Specimen Description IN/OUT CATH URINE  Final   Special Requests   Final    NONE Performed at [facility], [address], [place], [place]    Culture >=100,000 COLONIES/mL ENTEROCOCCUS FAECALIS (A)  Final   Report Status 05/24/2019 FINAL  Final   Organism ID, Bacteria ENTEROCOCCUS FAECALIS (A)  Final      Susceptibility   Enterococcus faecalis - MIC*    AMPICILLIN <=2 SENSITIVE Sensitive     NITROFURANTOIN 32 SENSITIVE Sensitive     VANCOMYCIN 8 INTERMEDIATE Intermediate     LINEZOLID 2 SENSITIVE Sensitive     * >=100,000 COLONIES/mL ENTEROCOCCUS FAECALIS  Blood Culture (routine x 2)     Status: None (Preliminary result)   Collection Time: 05/22/19  5:33 PM   Specimen: BLOOD LEFT HAND  Result Value Ref Range Status   Specimen Description BLOOD LEFT HAND  Final   Special Requests   Final    BOTTLES DRAWN AEROBIC ONLY Blood Culture results may not be optimal due to an inadequate volume of blood received in culture  bottles   Culture   Final    NO GROWTH 3 DAYS Performed at [facility], [address] [place], [place]    Report Status PENDING  Incomplete  SARS CORONAVIRUS 2 (TAT 6-24  HRS) Nasopharyngeal Nasopharyngeal Swab     Status: None   Collection Time: 05/22/19  7:08 PM   Specimen: Nasopharyngeal Swab  Result Value Ref Range Status   SARS Coronavirus 2 NEGATIVE NEGATIVE Final    Comment: (NOTE) SARS-CoV-2 target nucleic acids are NOT DETECTED. The SARS-CoV-2 RNA is generally detectable in upper and lower respiratory specimens during the acute phase of infection. Negative results do not preclude SARS-CoV-2 infection, do not rule out co-infections with other pathogens, and should not be used as the sole basis for treatment or other patient management decisions. Negative results must be combined with clinical observations, patient history, and epidemiological information. The expected result is Negative. Fact Sheet for Patients: HairSlick.no Fact Sheet for Healthcare Providers: quierodirigir.com This test is not yet approved or cleared by the [place] FDA and  has been authorized for detection and/or diagnosis of SARS-CoV-2 by FDA under an Emergency Use Authorization (EUA). This EUA will remain  in effect (meaning this test can be used) for the duration of the COVID-19 declaration under Section 56 4(b)(1) of the Act, 21 U.S.C. section 360bbb-3(b)(1), unless the authorization is terminated or revoked sooner. Performed at [facility], [address], [place], [place] [address]   MRSA PCR Screening     Status: None   Collection Time: 05/23/19 10:29 AM   Specimen: Nasopharyngeal  Result Value Ref Range Status   MRSA by PCR NEGATIVE NEGATIVE Final    Comment:        The GeneXpert MRSA Assay (FDA approved for NASAL specimens only), is one component of a comprehensive MRSA colonization surveillance  program. It is not intended to diagnose MRSA infection nor to guide or monitor treatment for MRSA infections. Performed at [facility], [address], [place], [place] [address]          Radiology Studies: No results found.      Scheduled Meds: . allopurinol  300 mg Oral Daily  . Chlorhexidine Gluconate Cloth  6 each Topical Daily  . collagenase   Topical Daily  . dabigatran  150 mg Oral BID  . feeding supplement (PRO-STAT SUGAR FREE 64)  30 mL Oral TID  . gabapentin  600 mg Oral TID  . Gerhardt's butt cream   Topical TID  . ipratropium-albuterol  3 mL Nebulization BID  . mometasone-formoterol  2 puff Inhalation BID  . multivitamin with minerals  1 tablet Oral Daily  . pantoprazole  40 mg Oral Daily  . rosuvastatin  40 mg Oral Daily  . umeclidinium bromide  1 puff Inhalation Daily  . venlafaxine XR  150 mg Oral Q breakfast   Continuous Infusions: . sodium chloride 1,000 mL (05/25/19 1409)  .  ceFAZolin (ANCEF) IV 2 g (05/26/19 0558)     LOS: 4 days    Time spent: 25 mins     Sunnie Nielsen, MD [facility]  05/26/2019, 12:56 PM

## 2019-05-26 NOTE — Plan of Care (Signed)
  Problem: Education: Goal: Knowledge of General Education information will improve Description: Including pain rating scale, medication(s)/side effects and non-pharmacologic comfort measures 05/26/2019 0045 by Bertis Ruddy, RN Outcome: Progressing 05/26/2019 0045 by Bertis Ruddy, RN Outcome: Progressing   Problem: Health Behavior/Discharge Planning: Goal: Ability to manage health-related needs will improve 05/26/2019 0045 by Bertis Ruddy, RN Outcome: Progressing 05/26/2019 0045 by Bertis Ruddy, RN Outcome: Progressing

## 2019-05-26 NOTE — Progress Notes (Signed)
Physical Therapy Treatment Patient Details Name: Karen Stanley MRN: [id number] DOB: Aug 23, 1950 Today's Date: 05/26/2019    History of Present Illness presented to ED for weakness and unable to get up from recliner for 9 days, recent admit to [facility] for CHF exacerbation, EMS found hypotensive on arrival, chest xray unremarkable, elevated lactate and WBC concern for sepsis, woulds on thigh and buttocksPMH: CAD s/p CABG, HF with EF 30-35%    PT Comments    RN called PT asking with assist to transfer pt back to bed. Attempted using stedy with assist of 3 however unable to obtain enough hip extension to place flaps down x4. Used maxi move to transfer pt back to bed.  Demonstrates marked weakness in BLEs and core. Pt fatigued after sitting in chair for 1 hour. Recommend nursing use maxi move for further transfers OOB. Continue to recommend SNF. Will follow.   Follow Up Recommendations  SNF     Equipment Recommendations  None recommended by PT    Recommendations for Other Services       Precautions / Restrictions Precautions Precautions: Fall Restrictions Weight Bearing Restrictions: No    Mobility  Bed Mobility Overal bed mobility: Needs Assistance Bed Mobility: Sit to Supine Rolling: Mod assist;+2 for physical assistance   Supine to sit: Mod assist;HOB elevated Sit to supine: Total assist;HOB elevated   General bed mobility comments: Rolling to right/left to remove pad.  Transfers Overall transfer level: Needs assistance Equipment used: Ambulation equipment used Transfers: Sit to/from Stand Sit to Stand: Max assist;+2 physical assistance(+3) Stand pivot transfers: Total assist       General transfer comment: Attempted to stand from chair x4 using pad and stedy with 3+ people and able to clear bottom x3 but not able to get enough hip extension to place bottom pieces down. Used maximove to return pt to bed.  Ambulation/Gait             General Gait  Details: Unable   Stairs             Wheelchair Mobility    Modified Rankin (Stroke Patients Only)       Balance Overall balance assessment: Needs assistance Sitting-balance support: Feet supported;Single extremity supported Sitting balance-Leahy Scale: Poor Sitting balance - Comments: Mod A to lean back off chair but only able to sit unsupported for a few seconds. Performed lateral lean to right/left to place lift pad. Postural control: Posterior lean Standing balance support: During functional activity Standing balance-Leahy Scale: Poor Standing balance comment: Requires UE support and min A for standing balance; bil knee instability noted. Cues for upright and hip extension.                            Cognition Arousal/Alertness: Awake/alert Behavior During Therapy: WFL for tasks assessed/performed Overall Cognitive Status: No family/caregiver present to determine baseline cognitive functioning                                 General Comments: Motivated to work with PT.      Exercises General Exercises - Lower Extremity Ankle Circles/Pumps: Both;15 reps;Supine Long Arc Quad: Both;5 reps;Seated Hip Flexion/Marching: Both;5 reps;Seated    General Comments General comments (skin integrity, edema, etc.): BLE edema. Reports edema is improved.      Pertinent Vitals/Pain Pain Assessment: Faces Faces Pain Scale: Hurts a little bit Pain Location: legs  once in sling Pain Descriptors / Indicators: Grimacing;Discomfort Pain Intervention(s): Repositioned;Monitored during session    Home Living                      Prior Function            PT Goals (current goals can now be found in the care plan section) Progress towards PT goals: Not progressing toward goals - comment(weakness after sitting in chair)    Frequency    Min 2X/week      PT Plan Current plan remains appropriate    Co-evaluation              AM-PAC  PT "6 Clicks" Mobility   Outcome Measure  Help needed turning from your back to your side while in a flat bed without using bedrails?: A Lot Help needed moving from lying on your back to sitting on the side of a flat bed without using bedrails?: A Lot Help needed moving to and from a bed to a chair (including a wheelchair)?: Total Help needed standing up from a chair using your arms (e.g., wheelchair or bedside chair)?: Total Help needed to walk in hospital room?: Total Help needed climbing 3-5 steps with a railing? : Total 6 Click Score: 8    End of Session Equipment Utilized During Treatment: Gait belt Activity Tolerance: Patient limited by fatigue Patient left: in bed;with call bell/phone within reach;with bed alarm set;with nursing/sitter in room Nurse Communication: Mobility status;Need for lift equipment PT Visit Diagnosis: Muscle weakness (generalized) (M62.81);Difficulty in walking, not elsewhere classified (R26.2)     Time: 1426-1500 PT Time Calculation (min) (ACUTE ONLY): 34 min  Charges:  $Therapeutic Activity: 23-37 mins                     Vale Haven, PT, DPT Acute Rehabilitation Services Pager [phone] Office [phone]       Blake Divine A Lanier Ensign 05/26/2019, 3:30 PM

## 2019-05-26 NOTE — Progress Notes (Signed)
error 

## 2019-05-26 NOTE — Progress Notes (Signed)
Physical Therapy Treatment Patient Details Name: Karen Stanley MRN: [id number] DOB: 09-Nov-1950 Today's Date: 05/26/2019    History of Present Illness presented to ED for weakness and unable to get up from recliner for 9 days, recent admit to [facility] for CHF exacerbation, EMS found hypotensive on arrival, chest xray unremarkable, elevated lactate and WBC concern for sepsis, woulds on thigh and buttocksPMH: CAD s/p CABG, HF with EF 30-35%    PT Comments    Patient progressing slowly towards PT goals. Motivated to work with therapy so she can get out of the hospital and get stronger. Requires Mod A for bed mobility and MAx A of 2 to stand from EOB. Used stedy to transfer to chair with pt standing multiple times; bil knee instability noted. Fatigues quickly. VSS throughout. Tolerated there ex sitting in chair. Noted to have BLE edema which pt reports is better than previously. Will follow.    Follow Up Recommendations  SNF     Equipment Recommendations  None recommended by PT    Recommendations for Other Services       Precautions / Restrictions Precautions Precautions: Fall Restrictions Weight Bearing Restrictions: No    Mobility  Bed Mobility Overal bed mobility: Needs Assistance Bed Mobility: Supine to Sit     Supine to sit: Mod assist;HOB elevated     General bed mobility comments: Able to bring LEs to EOB, assist needed with trunk. Posterior bias. + dizziness whichr esolved.  Transfers Overall transfer level: Needs assistance Equipment used: Rolling walker (2 wheeled);Ambulation equipment used Transfers: Sit to/from [employer] Sit to Stand: Max assist;+2 physical assistance Stand pivot transfers: Total assist       General transfer comment: Stood from EOB x1 with RW, able to stand using stedy with Max A of 2 for hip extension, stood from stedy x1 with cues for technique to transfer to chair.  Ambulation/Gait             General Gait  Details: Unable   Stairs             Wheelchair Mobility    Modified Rankin (Stroke Patients Only)       Balance Overall balance assessment: Needs assistance Sitting-balance support: Feet supported;Single extremity supported Sitting balance-Leahy Scale: Fair Sitting balance - Comments: Able to sit with close min guard. Postural control: Posterior lean Standing balance support: During functional activity Standing balance-Leahy Scale: Poor Standing balance comment: Requires UE support and min A for standing balance; bil knee instability noted. Cues for upright and hip extension.                            Cognition Arousal/Alertness: Awake/alert Behavior During Therapy: WFL for tasks assessed/performed Overall Cognitive Status: No family/caregiver present to determine baseline cognitive functioning                                 General Comments: Motivated to work with PT.      Exercises General Exercises - Lower Extremity Ankle Circles/Pumps: Both;15 reps;Supine Long Arc Quad: Both;5 reps;Seated Hip Flexion/Marching: Both;5 reps;Seated    General Comments General comments (skin integrity, edema, etc.): BLE edema. Reports edema is improved.      Pertinent Vitals/Pain Pain Assessment: No/denies pain    Home Living  Prior Function            PT Goals (current goals can now be found in the care plan section) Progress towards PT goals: Progressing toward goals    Frequency    Min 2X/week      PT Plan Current plan remains appropriate    Co-evaluation              AM-PAC PT "6 Clicks" Mobility   Outcome Measure  Help needed turning from your back to your side while in a flat bed without using bedrails?: A Lot Help needed moving from lying on your back to sitting on the side of a flat bed without using bedrails?: A Lot Help needed moving to and from a bed to a chair (including a  wheelchair)?: Total Help needed standing up from a chair using your arms (e.g., wheelchair or bedside chair)?: Total Help needed to walk in hospital room?: Total Help needed climbing 3-5 steps with a railing? : Total 6 Click Score: 8    End of Session Equipment Utilized During Treatment: Gait belt Activity Tolerance: Patient limited by fatigue;Patient tolerated treatment well Patient left: in chair;with call bell/phone within reach;with chair alarm set Nurse Communication: Mobility status;Need for lift equipment PT Visit Diagnosis: Muscle weakness (generalized) (M62.81);Difficulty in walking, not elsewhere classified (R26.2)     Time: 4481-8563 PT Time Calculation (min) (ACUTE ONLY): 23 min  Charges:  $Therapeutic Activity: 23-37 mins                     Marisa Severin, PT, DPT Acute Rehabilitation Services Pager [phone] Office [phone]       Marguarite Arbour A Sabra Heck 05/26/2019, 3:11 PM

## 2019-05-26 NOTE — TOC Progression Note (Signed)
Transition of Care [facility]) - Progression Note    Patient Details  Name: Karen Stanley MRN: [id number] Date of Birth: 1950-08-05  Transition of Care [facility] CM/SW Contact  Gildardo Griffes, [place] Phone Number: [phone] 05/26/2019, 8:46 AM  Clinical Narrative:     Patient referrals have been faxed out, pending SNF bed offers at this time.   Expected Discharge Plan: Skilled Nursing Facility Barriers to Discharge: Continued Medical Work up  Expected Discharge Plan and Services Expected Discharge Plan: Skilled Nursing Facility     Post Acute Care Choice: Skilled Nursing Facility Living arrangements for the past 2 months: Single Family Home                                       Social Determinants of Health (SDOH) Interventions    Readmission Risk Interventions No flowsheet data found.

## 2019-05-27 LAB — CULTURE, BLOOD (ROUTINE X 2)
Culture: NO GROWTH
Culture: NO GROWTH
Special Requests: ADEQUATE

## 2019-05-27 LAB — SARS CORONAVIRUS 2 (TAT 6-24 HRS): SARS Coronavirus 2: NEGATIVE

## 2019-05-27 MED ORDER — NITROFURANTOIN MONOHYD MACRO 100 MG PO CAPS
100.0000 mg | ORAL_CAPSULE | Freq: Two times a day (BID) | ORAL | Status: DC
  Administered 2019-05-27 – 2019-05-28 (×4): 100 mg via ORAL
  Filled 2019-05-27 (×7): qty 1

## 2019-05-27 NOTE — Progress Notes (Signed)
PROGRESS NOTE    Karen Stanley  CHY:[id number] DOB: [birth year] DOA: 05/22/2019 PCP: System, Pcp Not In:    Brief Narrative:   Admitted 05/22/19 for SIRS/cellulitis L thigh and hypotension, started Vanc/Rocephin and folloing, Cx, Latate, Procalcitonin. PT consulted, Afib on Pradaxa, ?beta blocker. CHF, lastix held d/t hypotension  05/23/19 lactate trending down, still holding Lasix and beta blockers d/t hypotension, PT recommended SNF   05/24/19 Lactate continues to trend down, Vanc D/C MRSA Neg, still holding Lasix and beta blocker   05/25/19 hypotension somewhat improved. Awaiting SNF offer   05/26/19 stable and blood Cx negative so far, we are not doing much else, hopefully can go tomorrow pending SNF offer. Ucx positive enterococcus. BP up today systolic 140's-170's, restarted Lasix and will monitor BP, might add ACEI/BB as well if BP tolerates. [place] reported pt c/o CP which resolved w/ meal, pt concerned for heartburn, EKG reviewed and no ST changes c/w ACS but bigeminy is noted and frequent PVC< wide QRS which was present on previous EKG, can follow symptoms and PCP should repeat EKG on follow-up   Today 05/27/19, patient reported some dysuria prior to arrival, will go ahead and treat for UTI as opposed to asymptomatic bacteriuria. Still waiting on placement. Will check back with SW.    Assessment & Plan:   Principal Problem:   SIRS (systemic inflammatory response syndrome) (HCC) Active Problems:   Essential hypertension   Coronary atherosclerosis   PAF (paroxysmal atrial fibrillation) (HCC)   Cellulitis   Cardiomyopathy (HCC) 30-35 % EF 11/20   Pressure injury of skin  Cellulitis  of the left inner thigh patient admitted with increasing lower extremity edema and left lower extremity erythema leukocytosis hypotension and lactic acid elevation. BP improved Continue Abx w/ Ancef Continue to monitor off IV fluids. She already has pitting edema to lower  extremities.  UTI Added po macrobid for 5 day course, today is Day 1  Chronic systolic heart failure ejection fraction 30 to 35% BP better, restarted Lasix She is on 2 L of oxygen which she was on prior to admission.   COPD She reports she was on Spiriva, Dulera, and nebulizers every 4 prior to admission though this is not in the med list. She has no respiratory complaints today   Paroxysmal A. fib  on Pradaxa continue. Rate controlled. Not on any medications for rate control. Sinus rhythm on EKG today w/ bigeminy/PVC  CAD/hyperlipidemia  continue statins and Pradaxa.  She is on Imdur which was on hold due to soft BP but I restarted this today   history of gout  on allopurinol.  severe deconditioning and debility patient seen by physical therapy and recommends SNF.  moisture associated skin damage to the buttocks and groin appreciate wound care input. Patient reports she was on diaper for multiple days and was sitting in feces and urine for long periods of time. Topical treatments per wound care nurse. Air mattress ordered. But cream to Ripple moisture and promote healing and Santyl for chemical debridements to wounds with slough's on the buttocks.     Pressure Injury 05/23/19 Buttocks Right Stage 2 -  Partial thickness loss of dermis presenting as a shallow open injury with a red, pink wound bed without slough. these are full thickness wounds across bilat buttocks related to moisture, not pressure (Active)  05/23/19 0016  Location: Buttocks  Location Orientation: Right  Staging: Stage 2 -  Partial thickness loss of dermis presenting as a shallow open injury with a red,  pink wound bed without slough.  Wound Description (Comments): these are full thickness wounds across bilat buttocks related to moisture, not pressure  Present on Admission: Yes       DVT prophylaxis: pt on Pradaxa outpatient, this was continued  Code Status: DNR Family Communication:  nothing today Disposition Plan: awaiting SNF offer   Consultants:  PT   Procedures:  None  Antimicrobials: Anti-infectives (From admission, onward)   Start     Dose/Rate Route Frequency Ordered Stop   05/27/19 1400  nitrofurantoin (macrocrystal-monohydrate) (MACROBID) capsule 100 mg     100 mg Oral Every 12 hours 05/27/19 1345 06/01/19 0959   05/24/19 1400  ceFAZolin (ANCEF) IVPB 2g/100 mL premix     2 g 200 mL/hr over 30 Minutes Intravenous Every 8 hours 05/24/19 1022     05/24/19 0900  vancomycin (VANCOREADY) IVPB 1500 mg/300 mL  Status:  Discontinued     1,500 mg 150 mL/hr over 120 Minutes Intravenous Every 24 hours 05/23/19 0828 05/24/19 1022   05/23/19 0900  vancomycin (VANCOCIN) 2,500 mg in sodium chloride 0.9 % 500 mL IVPB     2,500 mg 250 mL/hr over 120 Minutes Intravenous NOW 05/23/19 0828 05/23/19 1514   05/22/19 1700  cefTRIAXone (ROCEPHIN) 2 g in sodium chloride 0.9 % 100 mL IVPB  Status:  Discontinued     2 g 200 mL/hr over 30 Minutes Intravenous Every 24 hours 05/22/19 1639 05/24/19 1022       Subjective: Pt feeling well today, no concerns.   Objective: Vitals:   05/27/19 0835 05/27/19 1222 05/27/19 1239 05/27/19 1244  BP:  99/71 (!) 100/48 109/70  Pulse: 100 (!) 104 97 97  Resp: 18 20 20 20   Temp:  98.4 F (36.9 C) 98.4 F (36.9 C) 98.4 F (36.9 C)  TempSrc:  Oral Oral Oral  SpO2: 98% 94% 94% 94%  Weight:      Height:        Intake/Output Summary (Last 24 hours) at 05/27/2019 1344 Last data filed at 05/27/2019 1223 Gross per 24 hour  Intake 1080 ml  Output 2600 ml  Net -1520 ml   Filed Weights   05/24/19 0021 05/25/19 0358 05/26/19 0521  Weight: 121.1 kg 115.2 kg 116.2 kg    Examination:  General exam: Appears calm and comfortable  Respiratory system: Clear to auscultation. Respiratory effort normal. Cardiovascular system: S1 & S2 heard, RRR. No JVD, murmurs, rubs, gallops or clicks. No pedal edema. Gastrointestinal system: Abdomen is  nondistended, soft and nontender. No organomegaly or masses felt. Normal bowel sounds heard. Central nervous system: Alert and oriented. No focal neurological deficits. Extremities: Symmetric 5 x 5 power. Skin: No rashes, lesions or ulcers. Slight erythema and some skin peeling in groin bilaterally Psychiatry: Judgement and insight appear fair. Mood & affect appropriate.     Data Reviewed: I have personally reviewed following labs and imaging studies  CBC: Recent Labs  Lab 05/22/19 1643 05/24/19 0518 05/25/19 0437 05/26/19 0527  WBC 12.3* 6.3 7.1 5.8  NEUTROABS 8.9* 3.9 4.2 3.1  HGB 13.5 9.6* 9.9* 9.5*  HCT 41.1 30.0* 30.0* 30.2*  MCV 102.8* 103.1* 102.0* 105.2*  PLT 333 236 243 234   Basic Metabolic Panel: Recent Labs  Lab 05/22/19 1643 05/24/19 0518 05/25/19 0437 05/26/19 0527  NA 136 141 139 142  K 4.4 3.5 3.7 4.3  CL 95* 106 105 106  CO2 30 24 27 27   GLUCOSE 126* 85 95 90  BUN 26* 15 18 18  CREATININE 0.80 0.83 0.76 0.60  CALCIUM 9.3 8.5* 8.3* 8.2*   GFR: Estimated Creatinine Clearance: 80.2 mL/min (by C-G formula based on SCr of 0.6 mg/dL). Liver Function Tests: Recent Labs  Lab 05/22/19 1643 05/24/19 0518 05/25/19 0437 05/26/19 0527  AST 24 20 20 24   ALT 16 13 12 12   ALKPHOS 78 54 55 56  BILITOT 1.0 0.6 0.4 0.6  PROT 6.4* 4.7* 4.8* 4.4*  ALBUMIN 2.3* 1.6* 1.7* 1.7*   No results for input(s): LIPASE, AMYLASE in the last 168 hours. No results for input(s): AMMONIA in the last 168 hours. Coagulation Profile: No results for input(s): INR, PROTIME in the last 168 hours. Cardiac Enzymes: Recent Labs  Lab 05/22/19 1643  CKTOTAL 26*   BNP (last 3 results) No results for input(s): PROBNP in the last 8760 hours. HbA1C: No results for input(s): HGBA1C in the last 72 hours. CBG: No results for input(s): GLUCAP in the last 168 hours. Lipid Profile: No results for input(s): CHOL, HDL, LDLCALC, TRIG, CHOLHDL, LDLDIRECT in the last 72 hours. Thyroid  Function Tests: No results for input(s): TSH, T4TOTAL, FREET4, T3FREE, THYROIDAB in the last 72 hours. Anemia Panel: Recent Labs    05/25/19 0437  VITAMINB12 349  FOLATE 2.9*  FERRITIN 458*  TIBC 167*  IRON 28  RETICCTPCT 2.6   Urine analysis:    Component Value Date/Time   COLORURINE YELLOW 05/22/2019 1652   APPEARANCEUR CLEAR 05/22/2019 1652   LABSPEC 1.023 05/22/2019 1652   PHURINE 5.0 05/22/2019 1652   GLUCOSEU NEGATIVE 05/22/2019 1652   HGBUR NEGATIVE 05/22/2019 1652   BILIRUBINUR NEGATIVE 05/22/2019 1652   KETONESUR NEGATIVE 05/22/2019 1652   PROTEINUR NEGATIVE 05/22/2019 1652   UROBILINOGEN 0.2 02/03/2010 2339   NITRITE NEGATIVE 05/22/2019 1652   LEUKOCYTESUR NEGATIVE 05/22/2019 1652   Sepsis Labs: @LABRCNTIP (procalcitonin:4,lacticidven:4)  ) Recent Results (from the past 240 hour(s))  Blood Culture (routine x 2)     Status: None   Collection Time: 05/22/19  4:45 PM   Specimen: BLOOD  Result Value Ref Range Status   Specimen Description BLOOD RIGHT ANTECUBITAL  Final   Special Requests   Final    BOTTLES DRAWN AEROBIC AND ANAEROBIC Blood Culture adequate volume   Culture   Final    NO GROWTH 5 DAYS Performed at [facility], [place] [address] [place], [place] [address]    Report Status 05/27/2019 FINAL  Final  Urine culture     Status: Abnormal   Collection Time: 05/22/19  4:52 PM   Specimen: In/Out Cath Urine  Result Value Ref Range Status   Specimen Description IN/OUT CATH URINE  Final   Special Requests   Final    NONE Performed at [facility], [place] [address] [place], [place] [address]    Culture >=100,000 COLONIES/mL ENTEROCOCCUS FAECALIS (A)  Final   Report Status 05/24/2019 FINAL  Final   Organism ID, Bacteria ENTEROCOCCUS FAECALIS (A)  Final      Susceptibility   Enterococcus faecalis - MIC*    AMPICILLIN <=2 SENSITIVE Sensitive     NITROFURANTOIN 32 SENSITIVE Sensitive     VANCOMYCIN 8 INTERMEDIATE Intermediate     LINEZOLID 2  SENSITIVE Sensitive     * >=100,000 COLONIES/mL ENTEROCOCCUS FAECALIS  Blood Culture (routine x 2)     Status: None   Collection Time: 05/22/19  5:33 PM   Specimen: BLOOD LEFT HAND  Result Value Ref Range Status   Specimen Description BLOOD LEFT HAND  Final  Special Requests   Final    BOTTLES DRAWN AEROBIC ONLY Blood Culture results may not be optimal due to an inadequate volume of blood received in culture bottles   Culture   Final    NO GROWTH 5 DAYS Performed at [facility], [address], [place], [place] [address]    Report Status 05/27/2019 FINAL  Final  SARS CORONAVIRUS 2 (TAT 6-24 HRS) Nasopharyngeal Nasopharyngeal Swab     Status: None   Collection Time: 05/22/19  7:08 PM   Specimen: Nasopharyngeal Swab  Result Value Ref Range Status   SARS Coronavirus 2 NEGATIVE NEGATIVE Final    Comment: (NOTE) SARS-CoV-2 target nucleic acids are NOT DETECTED. The SARS-CoV-2 RNA is generally detectable in upper and lower respiratory specimens during the acute phase of infection. Negative results do not preclude SARS-CoV-2 infection, do not rule out co-infections with other pathogens, and should not be used as the sole basis for treatment or other patient management decisions. Negative results must be combined with clinical observations, patient history, and epidemiological information. The expected result is Negative. Fact Sheet for Patients: HairSlick.no Fact Sheet for Healthcare Providers: quierodirigir.com This test is not yet approved or cleared by the [place] FDA and  has been authorized for detection and/or diagnosis of SARS-CoV-2 by FDA under an Emergency Use Authorization (EUA). This EUA will remain  in effect (meaning this test can be used) for the duration of the COVID-19 declaration under Section 56 4(b)(1) of the Act, 21 U.S.C. section 360bbb-3(b)(1), unless the authorization is terminated or revoked  sooner. Performed at [facility], [address] [place], [place] [address]   MRSA PCR Screening     Status: None   Collection Time: 05/23/19 10:29 AM   Specimen: Nasopharyngeal  Result Value Ref Range Status   MRSA by PCR NEGATIVE NEGATIVE Final    Comment:        The GeneXpert MRSA Assay (FDA approved for NASAL specimens only), is one component of a comprehensive MRSA colonization surveillance program. It is not intended to diagnose MRSA infection nor to guide or monitor treatment for MRSA infections. Performed at [facility], [address] [place], [place] [address]          Radiology Studies: No results found.      Scheduled Meds: . allopurinol  300 mg Oral Daily  . Chlorhexidine Gluconate Cloth  6 each Topical Daily  . collagenase   Topical Daily  . dabigatran  150 mg Oral BID  . feeding supplement (PRO-STAT SUGAR FREE 64)  30 mL Oral TID  . furosemide  40 mg Oral Daily  . gabapentin  600 mg Oral TID  . Gerhardt's butt cream   Topical TID  . ipratropium-albuterol  3 mL Nebulization BID  . isosorbide mononitrate  30 mg Oral Daily  . mometasone-formoterol  2 puff Inhalation BID  . multivitamin with minerals  1 tablet Oral Daily  . pantoprazole  40 mg Oral Daily  . rosuvastatin  40 mg Oral Daily  . umeclidinium bromide  1 puff Inhalation Daily  . venlafaxine XR  150 mg Oral Q breakfast   Continuous Infusions: . sodium chloride 1,000 mL (05/25/19 1409)  .  ceFAZolin (ANCEF) IV 2 g (05/27/19 1326)     LOS: 5 days    Time spent: 25 mins     Sunnie Nielsen, MD [facility]  05/27/2019, 1:44 PM

## 2019-05-27 NOTE — TOC Progression Note (Signed)
Transition of Care [facility]) - Progression Note    Patient Details  Name: Karen Stanley MRN: [id number] Date of Birth: 1950-06-19  Transition of Care [facility]) CM/SW [place], [place] Phone Number: [phone] 05/27/2019, 2:29 PM  Clinical Narrative:    CSW met with patient and gave her bed offers. Patient picked a bed at [facility]. CSW spoke with facility and they will not have a bed available until Monday. [facility] also stated that patient would need COVID test within 3 days. Facility stated that they would get authorization once patient arrives due to waiver. CSW alerted MD about bed readiness and new COVID test.  TOC will continue to follow for disharge planning needs.  Expected Discharge Plan: [place] Barriers to Discharge: Continued Medical Work up  Expected Discharge Plan and Services Expected Discharge Plan: [place] Choice: [place] arrangements for the past 2 months: Single Family Home                                       Social Determinants of Health (SDOH) Interventions    Readmission Risk Interventions No flowsheet data found.

## 2019-05-28 NOTE — Progress Notes (Addendum)
PROGRESS NOTE    Karen Stanley  NLG:[id number] DOB: [birth year] DOA: 05/22/2019 PCP: System, Pcp Not In:    Brief Narrative:   Admitted 05/22/19 for SIRS/cellulitis L thigh and hypotension, started Vanc/Rocephin and folloing, Cx, Latate, Procalcitonin. PT consulted, Afib on Pradaxa, ?beta blocker. CHF, lastix held d/t hypotension  05/23/19 lactate trending down, still holding Lasix and beta blockers d/t hypotension, PT recommended SNF   05/24/19 Lactate continues to trend down, Vanc D/C MRSA Neg, still holding Lasix and beta blocker   05/25/19 hypotension somewhat improved. Awaiting SNF offer   05/26/19 stable and blood Cx negative so far, we are not doing much else, hopefully can go tomorrow pending SNF offer. Ucx positive enterococcus. BP up today systolic 140's-170's, restarted Lasix and will monitor BP, might add ACEI/BB as well if BP tolerates. Nurse reported pt c/o CP which resolved w/ meal, pt concerned for heartburn, EKG reviewed and no ST changes c/w ACS but bigeminy is noted and frequent PVC< wide QRS which was present on previous EKG, can follow symptoms and PCP should repeat EKG on follow-up   05/27/19, patient reported some dysuria prior to arrival, will go ahead and treat for UTI as opposed to asymptomatic bacteriuria. Still waiting on placement. Will check back with SW.   Today 05/28/19: SNF placement pending, has bed for Monday 05/28/18. COVID test negative 05/26/18. Possible d/c foley and IV meds, get patient OOB. She is feeling weak and tired this AM on rounds, breakfast untouched. If still lethargic this afternoon will obtain labs to eval anemia or metabolic derangement. If labs ok and VS continue to be stable, I'm not too concerned, I think she needs to get up and moving. Still waiting on placement. Will check back with SW.   Rechecked 3:57 PM patient reports she's feeling a lot better, still not really OOB but that's ok. Still planning for discharge tomorrow.         Assessment & Plan:   Principal Problem:   SIRS (systemic inflammatory response syndrome) (HCC) Active Problems:   Essential hypertension   Coronary atherosclerosis   PAF (paroxysmal atrial fibrillation) (HCC)   Cellulitis   Cardiomyopathy (HCC) 30-35 % EF 11/20   Pressure injury of skin  Cellulitis  of the left inner thigh patient admitted with increasing lower extremity edema and left lower extremity erythema leukocytosis hypotension and lactic acid elevation. BP improved Continue Abx w/ Ancef - today is Day 8 of antibiotics total, Day 5 Ancef for cellulitis, Day 2 Macrobid for UTI Continue to monitor off IV fluids.  UTI Added po macrobid for 5 day course, today is Day 2  Chronic systolic heart failure ejection fraction 30 to 35% BP better, restarted Lasix   COPD She reports she was on Spiriva, Dulera, and nebulizers every 4 prior to admission though this is not in the med list. She has no respiratory complaints today  Continue IH meds She is on 2 L of oxygen which she was on prior to admission.  Paroxysmal A. fib  on Pradaxa continue. Rate controlled. Not on any medications for rate control. Sinus rhythm on EKG w/ bigeminy/PVC  CAD/hyperlipidemia  continue statins and Pradaxa.  She is on Imdur which was on hold due to soft BP but I restarted this   history of gout  on allopurinol.  severe deconditioning and debility patient seen by physical therapy and recommends SNF Patient requires significant assistance w/ any movement, will try for OOB to chair today at least but if  pt unable to tolerate then that's that   moisture associated skin damage to the buttocks and groin appreciate wound care input. Patient reports she was on diaper for multiple days and was sitting in feces and urine for long periods of time. Topical treatments per wound care nurse. Air mattress ordered. But cream to Ripple moisture and promote healing and Santyl for chemical  debridements to wounds with slough's on the buttocks.     Pressure Injury 05/23/19 Buttocks Right Stage 2 -  Partial thickness loss of dermis presenting as a shallow open injury with a red, pink wound bed without slough. these are full thickness wounds across bilat buttocks related to moisture, not pressure (Active)  05/23/19 0016  Location: Buttocks  Location Orientation: Right  Staging: Stage 2 -  Partial thickness loss of dermis presenting as a shallow open injury with a red, pink wound bed without slough.  Wound Description (Comments): these are full thickness wounds across bilat buttocks related to moisture, not pressure  Present on Admission: Yes       DVT prophylaxis: pt on Pradaxa outpatient, this was continued  Code Status: DNR Family Communication: nothing today Disposition Plan: awaiting SNF placement for tomorrow 05/28/18   Consultants:  PT   Procedures:  None  Antimicrobials: Anti-infectives (From admission, onward)   Start     Dose/Rate Route Frequency Ordered Stop   05/27/19 1400  nitrofurantoin (macrocrystal-monohydrate) (MACROBID) capsule 100 mg     100 mg Oral Every 12 hours 05/27/19 1345 06/01/19 0959   05/24/19 1400  ceFAZolin (ANCEF) IVPB 2g/100 mL premix     2 g 200 mL/hr over 30 Minutes Intravenous Every 8 hours 05/24/19 1022     05/24/19 0900  vancomycin (VANCOREADY) IVPB 1500 mg/300 mL  Status:  Discontinued     1,500 mg 150 mL/hr over 120 Minutes Intravenous Every 24 hours 05/23/19 0828 05/24/19 1022   05/23/19 0900  vancomycin (VANCOCIN) 2,500 mg in sodium chloride 0.9 % 500 mL IVPB     2,500 mg 250 mL/hr over 120 Minutes Intravenous NOW 05/23/19 0828 05/23/19 1514   05/22/19 1700  cefTRIAXone (ROCEPHIN) 2 g in sodium chloride 0.9 % 100 mL IVPB  Status:  Discontinued     2 g 200 mL/hr over 30 Minutes Intravenous Every 24 hours 05/22/19 1639 05/24/19 1022       Subjective: Pt feeling well today, no concerns.   Objective: Vitals:    05/27/19 2111 05/28/19 0601 05/28/19 0605 05/28/19 0700  BP:  (!) 108/57    Pulse:  (!) 102  100  Resp:  20  20  Temp:  98 F (36.7 C)    TempSrc:  Oral    SpO2: 95% 97%  98%  Weight:   117.5 kg   Height:        Intake/Output Summary (Last 24 hours) at 05/28/2019 1105 Last data filed at 05/28/2019 0606 Gross per 24 hour  Intake 600 ml  Output 2500 ml  Net -1900 ml   Filed Weights   05/25/19 0358 05/26/19 0521 05/28/19 0605  Weight: 115.2 kg 116.2 kg 117.5 kg    Examination:  General exam: Appears calm and comfortable  Respiratory system: Clear to auscultation. Respiratory effort normal. Cardiovascular system: S1 & S2 heard, RRR. No JVD, murmurs, rubs, gallops or clicks. No pedal edema. Gastrointestinal system: Abdomen is nondistended, soft and nontender. No organomegaly or masses felt. Normal bowel sounds heard. Central nervous system: Alert and oriented. No focal neurological deficits. Extremities: Symmetric 5  x 5 power. Skin: No rashes, lesions or ulcers. Slight erythema and some skin peeling in groin bilaterally Psychiatry: Judgement and insight appear fair. Mood & affect appropriate.     Data Reviewed: I have personally reviewed following labs and imaging studies  CBC: Recent Labs  Lab 05/22/19 1643 05/24/19 0518 05/25/19 0437 05/26/19 0527  WBC 12.3* 6.3 7.1 5.8  NEUTROABS 8.9* 3.9 4.2 3.1  HGB 13.5 9.6* 9.9* 9.5*  HCT 41.1 30.0* 30.0* 30.2*  MCV 102.8* 103.1* 102.0* 105.2*  PLT 333 236 243 858   Basic Metabolic Panel: Recent Labs  Lab 05/22/19 1643 05/24/19 0518 05/25/19 0437 05/26/19 0527  NA 136 141 139 142  K 4.4 3.5 3.7 4.3  CL 95* 106 105 106  CO2 30 24 27 27   GLUCOSE 126* 85 95 90  BUN 26* 15 18 18   CREATININE 0.80 0.83 0.76 0.60  CALCIUM 9.3 8.5* 8.3* 8.2*   GFR: Estimated Creatinine Clearance: 80.8 mL/min (by C-G formula based on SCr of 0.6 mg/dL). Liver Function Tests: Recent Labs  Lab 05/22/19 1643 05/24/19 0518 05/25/19 0437  05/26/19 0527  AST 24 20 20 24   ALT 16 13 12 12   ALKPHOS 78 54 55 56  BILITOT 1.0 0.6 0.4 0.6  PROT 6.4* 4.7* 4.8* 4.4*  ALBUMIN 2.3* 1.6* 1.7* 1.7*   No results for input(s): LIPASE, AMYLASE in the last 168 hours. No results for input(s): AMMONIA in the last 168 hours. Coagulation Profile: No results for input(s): INR, PROTIME in the last 168 hours. Cardiac Enzymes: Recent Labs  Lab 05/22/19 1643  CKTOTAL 26*   BNP (last 3 results) No results for input(s): PROBNP in the last 8760 hours. HbA1C: No results for input(s): HGBA1C in the last 72 hours. CBG: No results for input(s): GLUCAP in the last 168 hours. Lipid Profile: No results for input(s): CHOL, HDL, LDLCALC, TRIG, CHOLHDL, LDLDIRECT in the last 72 hours. Thyroid Function Tests: No results for input(s): TSH, T4TOTAL, FREET4, T3FREE, THYROIDAB in the last 72 hours. Anemia Panel: No results for input(s): VITAMINB12, FOLATE, FERRITIN, TIBC, IRON, RETICCTPCT in the last 72 hours. Urine analysis:    Component Value Date/Time   COLORURINE YELLOW 05/22/2019 1652   APPEARANCEUR CLEAR 05/22/2019 1652   LABSPEC 1.023 05/22/2019 1652   PHURINE 5.0 05/22/2019 1652   GLUCOSEU NEGATIVE 05/22/2019 1652   HGBUR NEGATIVE 05/22/2019 1652   BILIRUBINUR NEGATIVE 05/22/2019 1652   KETONESUR NEGATIVE 05/22/2019 1652   PROTEINUR NEGATIVE 05/22/2019 1652   UROBILINOGEN 0.2 02/03/2010 2339   NITRITE NEGATIVE 05/22/2019 1652   LEUKOCYTESUR NEGATIVE 05/22/2019 1652   Sepsis Labs: @LABRCNTIP (procalcitonin:4,lacticidven:4)  ) Recent Results (from the past 240 hour(s))  Blood Culture (routine x 2)     Status: None   Collection Time: 05/22/19  4:45 PM   Specimen: BLOOD  Result Value Ref Range Status   Specimen Description BLOOD RIGHT ANTECUBITAL  Final   Special Requests   Final    BOTTLES DRAWN AEROBIC AND ANAEROBIC Blood Culture adequate volume   Culture   Final    NO GROWTH 5 DAYS Performed at [facility], [place]  [address] [place],  [address]    Report Status 05/27/2019 FINAL  Final  Urine culture     Status: Abnormal   Collection Time: 05/22/19  4:52 PM   Specimen: In/Out Cath Urine  Result Value Ref Range Status   Specimen Description IN/OUT CATH URINE  Final   Special Requests   Final    NONE  Performed at [facility], [address] [place], [place] [address]    Culture >=100,000 COLONIES/mL ENTEROCOCCUS FAECALIS (A)  Final   Report Status 05/24/2019 FINAL  Final   Organism ID, Bacteria ENTEROCOCCUS FAECALIS (A)  Final      Susceptibility   Enterococcus faecalis - MIC*    AMPICILLIN <=2 SENSITIVE Sensitive     NITROFURANTOIN 32 SENSITIVE Sensitive     VANCOMYCIN 8 INTERMEDIATE Intermediate     LINEZOLID 2 SENSITIVE Sensitive     * >=100,000 COLONIES/mL ENTEROCOCCUS FAECALIS  Blood Culture (routine x 2)     Status: None   Collection Time: 05/22/19  5:33 PM   Specimen: BLOOD LEFT HAND  Result Value Ref Range Status   Specimen Description BLOOD LEFT HAND  Final   Special Requests   Final    BOTTLES DRAWN AEROBIC ONLY Blood Culture results may not be optimal due to an inadequate volume of blood received in culture bottles   Culture   Final    NO GROWTH 5 DAYS Performed at [facility], [address] [place], [place] [address]    Report Status 05/27/2019 FINAL  Final  SARS CORONAVIRUS 2 (TAT 6-24 HRS) Nasopharyngeal Nasopharyngeal Swab     Status: None   Collection Time: 05/22/19  7:08 PM   Specimen: Nasopharyngeal Swab  Result Value Ref Range Status   SARS Coronavirus 2 NEGATIVE NEGATIVE Final    Comment: (NOTE) SARS-CoV-2 target nucleic acids are NOT DETECTED. The SARS-CoV-2 RNA is generally detectable in upper and lower respiratory specimens during the acute phase of infection. Negative results do not preclude SARS-CoV-2 infection, do not rule out co-infections with other pathogens, and should not be used as the sole basis for treatment or other patient management  decisions. Negative results must be combined with clinical observations, patient history, and epidemiological information. The expected result is Negative. Fact Sheet for Patients: HairSlick.nohttps://www.fda.gov/media/138098/download Fact Sheet for Healthcare Providers: quierodirigir.comhttps://www.fda.gov/media/138095/download This test is not yet approved or cleared by the [place] FDA and  has been authorized for detection and/or diagnosis of SARS-CoV-2 by FDA under an Emergency Use Authorization (EUA). This EUA will remain  in effect (meaning this test can be used) for the duration of the COVID-19 declaration under Section 56 4(b)(1) of the Act, 21 U.S.C. section 360bbb-3(b)(1), unless the authorization is terminated or revoked sooner. Performed at [facility], [address] [place], [place] [address]   MRSA PCR Screening     Status: None   Collection Time: 05/23/19 10:29 AM   Specimen: Nasopharyngeal  Result Value Ref Range Status   MRSA by PCR NEGATIVE NEGATIVE Final    Comment:        The GeneXpert MRSA Assay (FDA approved for NASAL specimens only), is one component of a comprehensive MRSA colonization surveillance program. It is not intended to diagnose MRSA infection nor to guide or monitor treatment for MRSA infections. Performed at [facility], [address] [place], [place] [address]   SARS CORONAVIRUS 2 (TAT 6-24 HRS) Nasopharyngeal Nasopharyngeal Swab     Status: None   Collection Time: 05/27/19  2:36 PM   Specimen: Nasopharyngeal Swab  Result Value Ref Range Status   SARS Coronavirus 2 NEGATIVE NEGATIVE Final    Comment: (NOTE) SARS-CoV-2 target nucleic acids are NOT DETECTED. The SARS-CoV-2 RNA is generally detectable in upper and lower respiratory specimens during the acute phase of infection. Negative results do not preclude SARS-CoV-2 infection, do not  rule out co-infections with other pathogens, and should not be used as the sole basis for treatment or  other patient management decisions. Negative results must be combined with clinical observations, patient history, and epidemiological information. The expected result is Negative. Fact Sheet for Patients: HairSlick.no Fact Sheet for Healthcare Providers: quierodirigir.com This test is not yet approved or cleared by the [place] FDA and  has been authorized for detection and/or diagnosis of SARS-CoV-2 by FDA under an Emergency Use Authorization (EUA). This EUA will remain  in effect (meaning this test can be used) for the duration of the COVID-19 declaration under Section 56 4(b)(1) of the Act, 21 U.S.C. section 360bbb-3(b)(1), unless the authorization is terminated or revoked sooner. Performed at [facility], [address], [place], [place] [address]          Radiology Studies: No results found.      Scheduled Meds: . allopurinol  300 mg Oral Daily  . Chlorhexidine Gluconate Cloth  6 each Topical Daily  . collagenase   Topical Daily  . dabigatran  150 mg Oral BID  . feeding supplement (PRO-STAT SUGAR FREE 64)  30 mL Oral TID  . furosemide  40 mg Oral Daily  . gabapentin  600 mg Oral TID  . Gerhardt's butt cream   Topical TID  . ipratropium-albuterol  3 mL Nebulization BID  . isosorbide mononitrate  30 mg Oral Daily  . mometasone-formoterol  2 puff Inhalation BID  . multivitamin with minerals  1 tablet Oral Daily  . nitrofurantoin (macrocrystal-monohydrate)  100 mg Oral Q12H  . pantoprazole  40 mg Oral Daily  . rosuvastatin  40 mg Oral Daily  . umeclidinium bromide  1 puff Inhalation Daily  . venlafaxine XR  150 mg Oral Q breakfast   Continuous Infusions: . sodium chloride 1,000 mL (05/25/19 1409)  .  ceFAZolin (ANCEF) IV 2 g (05/28/19 0520)     LOS: 6 days    Time spent: 25 mins     Sunnie Nielsen DO [facility] Pager [phone] or secure message in Epic   05/28/2019, 11:05  AM

## 2019-05-29 MED ORDER — NITROFURANTOIN MONOHYD MACRO 100 MG PO CAPS: 100.0000 mg | ORAL_CAPSULE | Freq: Two times a day (BID) | ORAL | 0 refills | Status: AC

## 2019-05-29 MED ORDER — MOMETASONE FURO-FORMOTEROL FUM 100-5 MCG/ACT IN AERO: 2.0000 | INHALATION_SPRAY | Freq: Two times a day (BID) | RESPIRATORY_TRACT | 0 refills | Status: AC

## 2019-05-29 MED ORDER — GERHARDT'S BUTT CREAM: 1.0000 "application " | TOPICAL_CREAM | Freq: Three times a day (TID) | CUTANEOUS | 0 refills | Status: DC

## 2019-05-29 MED ORDER — ADULT MULTIVITAMIN W/MINERALS CH: 1.0000 | ORAL_TABLET | Freq: Every day | ORAL | 0 refills | Status: DC

## 2019-05-29 MED ORDER — ONDANSETRON HCL 4 MG PO TABS: 4.0000 mg | ORAL_TABLET | Freq: Four times a day (QID) | ORAL | 0 refills | Status: DC | PRN

## 2019-05-29 MED ORDER — CEPHALEXIN 500 MG PO CAPS: 500.0000 mg | ORAL_CAPSULE | Freq: Two times a day (BID) | ORAL | 0 refills | Status: AC

## 2019-05-29 MED ORDER — UMECLIDINIUM BROMIDE 62.5 MCG/INH IN AEPB: 1.0000 | INHALATION_SPRAY | Freq: Every day | RESPIRATORY_TRACT | 0 refills | Status: AC

## 2019-05-29 MED ORDER — PRO-STAT SUGAR FREE PO LIQD: 30.0000 mL | Freq: Three times a day (TID) | ORAL | 0 refills | Status: DC

## 2019-05-29 NOTE — TOC Transition Note (Addendum)
Transition of Care [facility]) - CM/SW Discharge Note   Patient Details  Name: Karen Stanley MRN: [id number] Date of Birth: 1951-03-01  Transition of Care [facility]) CM/SW Contact:  Gildardo Griffes, LCSW Phone Number: 05/29/2019, 1:18 PM   Clinical Narrative:     Patient will DC to: [facility] Anticipated DC date: 05/29/2019 Family notified: Velna Hatchet Transport YY:TKPT  Per MD patient ready for DC to [facility] . RN, patient, patient's family, and facility notified of DC. Discharge Summary sent to facility. RN given number for report   [phone] Room 1201 P. DC packet on chart. Ambulance transport requested for patient.  CSW signing off.  [place], [place] [phone]   Final next level of care: Skilled Nursing Facility Barriers to Discharge: No Barriers Identified   Patient Goals and CMS Choice Patient states their goals for this hospitalization and ongoing recovery are:: to go to SNF CMS Medicare.gov Compare Post Acute Care list provided to:: Patient Represenative (must comment)(daughter Faustina) Choice offered to / list presented to : Adult Children  Discharge Placement PASRR number recieved: 05/25/19            Patient [facility] bed at: [facility] Patient to be transferred to facility by: [facility] Name of family member notified: Vallory Patient and family notified of of transfer: 05/29/19  Discharge Plan and Services     Post Acute Care Choice: Skilled Nursing Facility                               Social Determinants of Health (SDOH) Interventions     Readmission Risk Interventions No flowsheet data found.

## 2019-05-29 NOTE — Discharge Instructions (Signed)

## 2019-05-29 NOTE — TOC Progression Note (Signed)
Transition of Care [facility]) - Progression Note    Patient Details  Name: Karen Stanley MRN: [id number] Date of Birth: [birth year]  Transition of [facility]) CM/SW Contact  Gildardo Griffes, [place] Phone Number: [phone] 05/29/2019, 10:54 AM  Clinical Narrative:     CSW spoke with patient's daughter Makala regarding discharge plan of patient going to [place] [facility] today, She would like to be informed when Sharin Mons is called.   Expected Discharge Plan: Skilled Nursing Facility Barriers to Discharge: Continued Medical Work up  Expected Discharge Plan and Services Expected Discharge Plan: Skilled Nursing Facility     Post Acute Care Choice: Skilled Nursing Facility Living arrangements for the past 2 months: Single Family Home                                       Social Determinants of Health (SDOH) Interventions    Readmission Risk Interventions No flowsheet data found.

## 2019-05-29 NOTE — Care Management Important Message (Signed)
Important Message  Patient Details  Name: Karen Stanley MRN: [id number] Date of Birth: 14-Sep-1950   Medicare Important Message Given:  Yes     Renie Ora 05/29/2019, 1:08 PM

## 2019-05-29 NOTE — Progress Notes (Signed)
Pre-rounds chart review: LOS: 7  Overnight: no events per chart Pending: nothing  Dispo: to SNF today, can SW please confirm?   Thanks!     Sunnie Nielsen DO [facility]  Pager [phone]  or secure message in Epic

## 2019-05-29 NOTE — Discharge Summary (Signed)
Physician Discharge Summary  Karen Stanley EYC:[id number] DOB: [birth year] DOA: 05/22/2019  PCP: System, Pcp Not In  Admit date: 05/22/2019 Discharge date: 05/29/2019  Admitted From: home Disposition: SNF  Recommendations for Outpatient Follow-up:  1. Follow up with PCP in 1-2 weeks 2. Please obtain BMP/CBC in one week 3. Please follow up on the following pending results:   Discharge Condition: stable CODE STATUS: DNR Diet recommendation: Heart Healthy  Brief/Interim Summary:  Admitted 05/22/19 for SIRS/cellulitis L thigh and hypotension, started Vanc/Rocephin and folloing, Cx, Latate, Procalcitonin. PT consulted, Afib on Pradaxa, ?beta blocker. CHF, lastix held d/t hypotension  05/23/19 lactate trending down, still holding Lasix and beta blockers d/t hypotension, PT recommended SNF   05/24/19 Lactate continues to trend down, Vanc D/C MRSA Neg, still holding Lasix and beta blocker   05/25/19 hypotension somewhat improved. Awaiting SNF offer   05/26/19 stable and blood Cx negative so far, we are not doing much else, hopefully can go tomorrow pending SNF offer. Ucx positive enterococcus. BP up today systolic 140's-170's, restarted Lasix and will monitor BP, might add ACEI/BB as well if BP tolerates. Nurse reported pt c/o CP which resolved w/ meal, pt concerned for heartburn, EKG reviewed and no ST changes c/w ACS but bigeminy is noted and frequent PVC< wide QRS which was present on previous EKG, can follow symptoms and PCP should repeat EKG on follow-up   05/27/19, patient reported some dysuria prior to arrival, will go ahead and treat for UTI as opposed to asymptomatic bacteriuria. Still waiting on placement. Will check back with SW.   05/28/19: SNF placement pending, has bed for Monday 05/28/18. COVID test negative 05/26/18. Possible d/c foley and IV meds, get patient OOB. She is feeling weak and tired this AM on rounds, better in afternoon.   Today 05/29/19: Patient feeling well, a  little bit better in terms of energy compared to yesterday.  Hopeful she can go today  Discharge Diagnoses:  Principal Problem:   SIRS (systemic inflammatory response syndrome) (HCC) Active Problems:   Essential hypertension   Coronary atherosclerosis   PAF (paroxysmal atrial fibrillation) (HCC)   Cellulitis   Cardiomyopathy (HCC) 30-35 % EF 11/20   Pressure injury of skin   Cellulitis  of the left inner thigh patient admitted with increasing lower extremity edema and left lower extremity erythema leukocytosis hypotension and lactic acid elevation.  Complete course of p.o. Keflex on discharge Would consider keeping Foley in place for now take out as soon as able, incontinence/mobility is an issue and predisposing factor to cellulitis/UTI  UTI Complete course of p.o. Macrobid on discharge  Chronic systolic heart failure ejection fraction 30 to 35% BP better, restarted Lasix   COPD Continue IH meds She is on 2 L of oxygen which she was on prior to admission.  Paroxysmal A. fib  on Pradaxa continue. Rate controlled. Not on any medications for rate control. Sinus rhythm on EKG w/ bigeminy/PVC  CAD/hyperlipidemia  continue statins and Pradaxa.   history of gout  on allopurinol.  severe deconditioning and debility patient seen by physical therapy and recommends SNF  moisture associated skin damage to the buttocks and groin appreciate wound care input. Patient reports she was on diaper for multiple days and was sitting in feces and urine for long periods of time.  Patient has significant mobility challenges. Topical treatments per wound care nurse. Air mattress ordered. Butt cream to Ripple moisture and promote healing and Santyl for chemical debridements to wounds with slough's on the buttocks.  Pressure Injury 05/23/19 Buttocks Right Stage 2 - Partial thickness loss of dermis presenting as a shallow open injury with a red, pink wound bed without  slough. these are full thickness wounds across bilat buttocks related to moisture, not pressure (Active)  05/23/19 0016  Location: Buttocks  Location Orientation: Right  Staging: Stage 2 - Partial thickness loss of dermis presenting as a shallow open injury with a red, pink wound bed without slough.  Wound Description (Comments): these are full thickness wounds across bilat buttocks related to moisture, not pressure  Present on Admission: Yes       Discharge Instructions  Discharge Instructions    Call MD for:  difficulty breathing, headache or visual disturbances   Complete by: As directed    Call MD for:  persistant dizziness or light-headedness   Complete by: As directed    Call MD for:  severe uncontrolled pain   Complete by: As directed    Call MD for:  temperature >100.4   Complete by: As directed    Diet - low sodium heart healthy   Complete by: As directed    Increase activity slowly   Complete by: As directed      Allergies as of 05/29/2019   No Known Allergies     Medication List    STOP taking these medications   cefdinir 300 MG capsule Commonly known as: OMNICEF     TAKE these medications   allopurinol 300 MG tablet Commonly known as: ZYLOPRIM Take 300 mg by mouth daily.   cephALEXin 500 MG capsule Commonly known as: KEFLEX Take 1 capsule (500 mg total) by mouth 2 (two) times daily for 2 days.   dabigatran 150 MG Caps capsule Commonly known as: PRADAXA Take 150 mg by mouth 2 (two) times daily.   feeding supplement (PRO-STAT SUGAR FREE 64) Liqd Take 30 mLs by mouth 3 (three) times daily.   furosemide 40 MG tablet Commonly known as: LASIX Take 40 mg by mouth.   gabapentin 600 MG tablet Commonly known as: NEURONTIN Take 600 mg by mouth 3 (three) times daily.   Gerhardt's butt cream Crea Apply 1 application topically 3 (three) times daily.   isosorbide mononitrate 30 MG 24 hr tablet Commonly known as: IMDUR Take 30 mg by mouth daily.    mometasone-formoterol 100-5 MCG/ACT Aero Commonly known as: DULERA Inhale 2 puffs into the lungs 2 (two) times daily.   multivitamin with minerals Tabs tablet Take 1 tablet by mouth daily. Start taking on: May 30, 2019   nitrofurantoin (macrocrystal-monohydrate) 100 MG capsule Commonly known as: MACROBID Take 1 capsule (100 mg total) by mouth every 12 (twelve) hours for 4 days.   ondansetron 4 MG tablet Commonly known as: ZOFRAN Take 1 tablet (4 mg total) by mouth every 6 (six) hours as needed for nausea.   pantoprazole 40 MG tablet Commonly known as: PROTONIX Take 40 mg by mouth daily.   rosuvastatin 40 MG tablet Commonly known as: CRESTOR Take 40 mg by mouth daily.   umeclidinium bromide 62.5 MCG/INH Aepb Commonly known as: INCRUSE ELLIPTA Inhale 1 puff into the lungs daily. Start taking on: May 30, 2019   venlafaxine XR 150 MG 24 hr capsule Commonly known as: EFFEXOR-XR Take 150 mg by mouth daily with breakfast.      Contact information for after-discharge care    Destination    [facility] Preferred [facility] .   Service: Skilled Nursing Contact information: [address] [place] [phone]  No Known Allergies  Consultations:  none   Procedures/Studies: DG Chest Port 1 View  Result Date: 05/22/2019 CLINICAL DATA:  Weakness. EXAM: PORTABLE CHEST 1 VIEW COMPARISON:  Chest radiograph 05/09/2019 FINDINGS: Redemonstrated left chest AICD device. Mild cardiomegaly is unchanged. Prior CABG. No focal consolidation within the lungs. No evidence of pleural effusion or pneumothorax. No acute bony abnormality. IMPRESSION: No evidence of acute cardiopulmonary abnormality. Stable, mild cardiomegaly. Electronically Signed   By: Jackey Loge DO   On: 05/22/2019 17:12      Subjective:  Patient feeling well today, no complaints.   Discharge Exam: Vitals:   05/29/19 0854 05/29/19 1305  BP:  (!) 107/58  Pulse:  97   Resp: 18 18  Temp:  97.6 F (36.4 C)  SpO2:  93%   Vitals:   05/28/19 2043 05/29/19 0605 05/29/19 0854 05/29/19 1305  BP:  127/65  (!) 107/58  Pulse:  (!) 101  97  Resp:  19 18 18   Temp:  97.8 F (36.6 C)  97.6 F (36.4 C)  TempSrc:  Oral  Oral  SpO2: 95% 93%  93%  Weight:  118.8 kg    Height:        General: Pt is alert, awake, not in acute distress Cardiovascular: RRR, S1/S2 +, no rubs, no gallops Respiratory: CTA bilaterally, no wheezing, no rhonchi Skin: Erythema at groin is improved Abdominal: Soft, NT, ND, bowel sounds + Extremities: no edema, no cyanosis    The results of significant diagnostics from this hospitalization (including imaging, microbiology, ancillary and laboratory) are listed below for reference.     Microbiology: Recent Results (from the past 240 hour(s))  Blood Culture (routine x 2)     Status: None   Collection Time: 05/22/19  4:45 PM   Specimen: BLOOD  Result Value Ref Range Status   Specimen Description BLOOD RIGHT ANTECUBITAL  Final   Special Requests   Final    BOTTLES DRAWN AEROBIC AND ANAEROBIC Blood Culture adequate volume   Culture   Final    NO GROWTH 5 DAYS Performed at [facility], [address] [place], [place]    Report Status 05/27/2019 FINAL  Final  Urine culture     Status: Abnormal   Collection Time: 05/22/19  4:52 PM   Specimen: In/Out Cath Urine  Result Value Ref Range Status   Specimen Description IN/OUT CATH URINE  Final   Special Requests   Final    NONE Performed at [facility], [address], [place], [place]    Culture >=100,000 COLONIES/mL ENTEROCOCCUS FAECALIS (A)  Final   Report Status 05/24/2019 FINAL  Final   Organism ID, Bacteria ENTEROCOCCUS FAECALIS (A)  Final      Susceptibility   Enterococcus faecalis - MIC*    AMPICILLIN <=2 SENSITIVE Sensitive     NITROFURANTOIN 32 SENSITIVE Sensitive     VANCOMYCIN 8 INTERMEDIATE Intermediate     LINEZOLID 2 SENSITIVE  Sensitive     * >=100,000 COLONIES/mL ENTEROCOCCUS FAECALIS  Blood Culture (routine x 2)     Status: None   Collection Time: 05/22/19  5:33 PM   Specimen: BLOOD LEFT HAND  Result Value Ref Range Status   Specimen Description BLOOD LEFT HAND  Final   Special Requests   Final    BOTTLES DRAWN AEROBIC ONLY Blood Culture results may not be optimal due to an inadequate volume of blood received in culture bottles   Culture   Final  NO GROWTH 5 DAYS Performed at [facility], [address], [place], [place] [address]    Report Status 05/27/2019 FINAL  Final  SARS CORONAVIRUS 2 (TAT 6-24 HRS) Nasopharyngeal Nasopharyngeal Swab     Status: None   Collection Time: 05/22/19  7:08 PM   Specimen: Nasopharyngeal Swab  Result Value Ref Range Status   SARS Coronavirus 2 NEGATIVE NEGATIVE Final    Comment: (NOTE) SARS-CoV-2 target nucleic acids are NOT DETECTED. The SARS-CoV-2 RNA is generally detectable in upper and lower respiratory specimens during the acute phase of infection. Negative results do not preclude SARS-CoV-2 infection, do not rule out co-infections with other pathogens, and should not be used as the sole basis for treatment or other patient management decisions. Negative results must be combined with clinical observations, patient history, and epidemiological information. The expected result is Negative. Fact Sheet for Patients: HairSlick.no Fact Sheet for Healthcare Providers: quierodirigir.com This test is not yet approved or cleared by the [place] FDA and  has been authorized for detection and/or diagnosis of SARS-CoV-2 by FDA under an Emergency Use Authorization (EUA). This EUA will remain  in effect (meaning this test can be used) for the duration of the COVID-19 declaration under Section 56 4(b)(1) of the Act, 21 U.S.C. section 360bbb-3(b)(1), unless the authorization is terminated or revoked  sooner. Performed at [facility], [address] [place], [place] [address]   MRSA PCR Screening     Status: None   Collection Time: 05/23/19 10:29 AM   Specimen: Nasopharyngeal  Result Value Ref Range Status   MRSA by PCR NEGATIVE NEGATIVE Final    Comment:        The GeneXpert MRSA Assay (FDA approved for NASAL specimens only), is one component of a comprehensive MRSA colonization surveillance program. It is not intended to diagnose MRSA infection nor to guide or monitor treatment for MRSA infections. Performed at [facility], [address] [place], [place] [address]   SARS CORONAVIRUS 2 (TAT 6-24 HRS) Nasopharyngeal Nasopharyngeal Swab     Status: None   Collection Time: 05/27/19  2:36 PM   Specimen: Nasopharyngeal Swab  Result Value Ref Range Status   SARS Coronavirus 2 NEGATIVE NEGATIVE Final    Comment: (NOTE) SARS-CoV-2 target nucleic acids are NOT DETECTED. The SARS-CoV-2 RNA is generally detectable in upper and lower respiratory specimens during the acute phase of infection. Negative results do not preclude SARS-CoV-2 infection, do not rule out co-infections with other pathogens, and should not be used as the sole basis for treatment or other patient management decisions. Negative results must be combined with clinical observations, patient history, and epidemiological information. The expected result is Negative. Fact Sheet for Patients: HairSlick.no Fact Sheet for Healthcare Providers: quierodirigir.com This test is not yet approved or cleared by the [place] FDA and  has been authorized for detection and/or diagnosis of SARS-CoV-2 by FDA under an Emergency Use Authorization (EUA). This EUA will remain  in effect (meaning this test can be used) for the duration of the COVID-19 declaration under Section 56 4(b)(1) of the Act, 21 U.S.C. section 360bbb-3(b)(1), unless the authorization is  terminated or revoked sooner. Performed at [facility], [address] [place], [place] [address]      Labs: BNP (last 3 results) No results for input(s): BNP in the last 8760 hours. Basic Metabolic Panel: Recent Labs  Lab 05/22/19 1643 05/24/19 0518 05/25/19 0437 05/26/19 0527  NA 136 141 139  142  K 4.4 3.5 3.7 4.3  CL 95* 106 105 106  CO2 30 24 27 27   GLUCOSE 126* 85 95 90  BUN 26* 15 18 18   CREATININE 0.80 0.83 0.76 0.60  CALCIUM 9.3 8.5* 8.3* 8.2*   Liver Function Tests: Recent Labs  Lab 05/22/19 1643 05/24/19 0518 05/25/19 0437 05/26/19 0527  AST 24 20 20 24   ALT 16 13 12 12   ALKPHOS 78 54 55 56  BILITOT 1.0 0.6 0.4 0.6  PROT 6.4* 4.7* 4.8* 4.4*  ALBUMIN 2.3* 1.6* 1.7* 1.7*   No results for input(s): LIPASE, AMYLASE in the last 168 hours. No results for input(s): AMMONIA in the last 168 hours. CBC: Recent Labs  Lab 05/22/19 1643 05/24/19 0518 05/25/19 0437 05/26/19 0527  WBC 12.3* 6.3 7.1 5.8  NEUTROABS 8.9* 3.9 4.2 3.1  HGB 13.5 9.6* 9.9* 9.5*  HCT 41.1 30.0* 30.0* 30.2*  MCV 102.8* 103.1* 102.0* 105.2*  PLT 333 236 243 234   Cardiac Enzymes: Recent Labs  Lab 05/22/19 1643  CKTOTAL 26*   BNP: Invalid input(s): POCBNP CBG: No results for input(s): GLUCAP in the last 168 hours. D-Dimer No results for input(s): DDIMER in the last 72 hours. Hgb A1c No results for input(s): HGBA1C in the last 72 hours. Lipid Profile No results for input(s): CHOL, HDL, LDLCALC, TRIG, CHOLHDL, LDLDIRECT in the last 72 hours. Thyroid function studies No results for input(s): TSH, T4TOTAL, T3FREE, THYROIDAB in the last 72 hours.  Invalid input(s): FREET3 Anemia work up No results for input(s): VITAMINB12, FOLATE, FERRITIN, TIBC, IRON, RETICCTPCT in the last 72 hours. Urinalysis    Component Value Date/Time   COLORURINE YELLOW 05/22/2019 1652   APPEARANCEUR CLEAR 05/22/2019 1652   LABSPEC 1.023 05/22/2019 1652   PHURINE 5.0 05/22/2019 1652    GLUCOSEU NEGATIVE 05/22/2019 1652   HGBUR NEGATIVE 05/22/2019 1652   BILIRUBINUR NEGATIVE 05/22/2019 1652   KETONESUR NEGATIVE 05/22/2019 1652   PROTEINUR NEGATIVE 05/22/2019 1652   UROBILINOGEN 0.2 02/03/2010 2339   NITRITE NEGATIVE 05/22/2019 1652   LEUKOCYTESUR NEGATIVE 05/22/2019 1652   Sepsis Labs Invalid input(s): PROCALCITONIN,  WBC,  LACTICIDVEN Microbiology Recent Results (from the past 240 hour(s))  Blood Culture (routine x 2)     Status: None   Collection Time: 05/22/19  4:45 PM   Specimen: BLOOD  Result Value Ref Range Status   Specimen Description BLOOD RIGHT ANTECUBITAL  Final   Special Requests   Final    BOTTLES DRAWN AEROBIC AND ANAEROBIC Blood Culture adequate volume   Culture   Final    NO GROWTH 5 DAYS Performed at [facility], [address] [place], [place] [address]    Report Status 05/27/2019 FINAL  Final  Urine culture     Status: Abnormal   Collection Time: 05/22/19  4:52 PM   Specimen: In/Out Cath Urine  Result Value Ref Range Status   Specimen Description IN/OUT CATH URINE  Final   Special Requests   Final    NONE Performed at [facility], [address] [place], [place] [address]    Culture >=100,000 COLONIES/mL ENTEROCOCCUS FAECALIS (A)  Final   Report Status 05/24/2019 FINAL  Final   Organism ID, Bacteria ENTEROCOCCUS FAECALIS (A)  Final      Susceptibility   Enterococcus faecalis - MIC*    AMPICILLIN <=2 SENSITIVE Sensitive     NITROFURANTOIN 32 SENSITIVE Sensitive     VANCOMYCIN 8 INTERMEDIATE Intermediate     LINEZOLID 2  SENSITIVE Sensitive     * >=100,000 COLONIES/mL ENTEROCOCCUS FAECALIS  Blood Culture (routine x 2)     Status: None   Collection Time: 05/22/19  5:33 PM   Specimen: BLOOD LEFT HAND  Result Value Ref Range Status   Specimen Description BLOOD LEFT HAND  Final   Special Requests   Final    BOTTLES DRAWN AEROBIC ONLY Blood Culture results may not be optimal due to an inadequate volume of blood received in  culture bottles   Culture   Final    NO GROWTH 5 DAYS Performed at [facility], [place] [address], [place], [place] [address]    Report Status 05/27/2019 FINAL  Final  SARS CORONAVIRUS 2 (TAT 6-24 HRS) Nasopharyngeal Nasopharyngeal Swab     Status: None   Collection Time: 05/22/19  7:08 PM   Specimen: Nasopharyngeal Swab  Result Value Ref Range Status   SARS Coronavirus 2 NEGATIVE NEGATIVE Final    Comment: (NOTE) SARS-CoV-2 target nucleic acids are NOT DETECTED. The SARS-CoV-2 RNA is generally detectable in upper and lower respiratory specimens during the acute phase of infection. Negative results do not preclude SARS-CoV-2 infection, do not rule out co-infections with other pathogens, and should not be used as the sole basis for treatment or other patient management decisions. Negative results must be combined with clinical observations, patient history, and epidemiological information. The expected result is Negative. Fact Sheet for Patients: SugarRoll.be Fact Sheet for Healthcare Providers: https://www.woods-mathews.com/ This test is not yet approved or cleared by the [place] FDA and  has been authorized for detection and/or diagnosis of SARS-CoV-2 by FDA under an Emergency Use Authorization (EUA). This EUA will remain  in effect (meaning this test can be used) for the duration of the COVID-19 declaration under Section 56 4(b)(1) of the Act, 21 U.S.C. section 360bbb-3(b)(1), unless the authorization is terminated or revoked sooner. Performed at [facility], [place] [address], [place], [place] [address]   MRSA PCR Screening     Status: None   Collection Time: 05/23/19 10:29 AM   Specimen: Nasopharyngeal  Result Value Ref Range Status   MRSA by PCR NEGATIVE NEGATIVE Final    Comment:        The GeneXpert MRSA Assay (FDA approved for NASAL specimens only), is one component of a comprehensive MRSA  colonization surveillance program. It is not intended to diagnose MRSA infection nor to guide or monitor treatment for MRSA infections. Performed at [facility], [place] [address] [place], [place] [address]   SARS CORONAVIRUS 2 (TAT 6-24 HRS) Nasopharyngeal Nasopharyngeal Swab     Status: None   Collection Time: 05/27/19  2:36 PM   Specimen: Nasopharyngeal Swab  Result Value Ref Range Status   SARS Coronavirus 2 NEGATIVE NEGATIVE Final    Comment: (NOTE) SARS-CoV-2 target nucleic acids are NOT DETECTED. The SARS-CoV-2 RNA is generally detectable in upper and lower respiratory specimens during the acute phase of infection. Negative results do not preclude SARS-CoV-2 infection, do not rule out co-infections with other pathogens, and should not be used as the sole basis for treatment or other patient management decisions. Negative results must be combined with clinical observations, patient history, and epidemiological information. The expected result is Negative. Fact Sheet for Patients: SugarRoll.be Fact Sheet for Healthcare Providers: https://www.woods-mathews.com/ This test is not yet approved or cleared by the [place] FDA and  has been authorized for detection and/or diagnosis of SARS-CoV-2 by FDA under an Emergency Use Authorization (  EUA). This EUA will remain  in effect (meaning this test can be used) for the duration of the COVID-19 declaration under Section 56 4(b)(1) of the Act, 21 U.S.C. section 360bbb-3(b)(1), unless the authorization is terminated or revoked sooner. Performed at [facility], [address] [place], [place] [address]      Time coordinating discharge: Over 30 minutes  SIGNED:   Sunnie Nielsen, MD  [facility]

## 2019-07-02 ENCOUNTER — Emergency Department (HOSPITAL_COMMUNITY): Payer: Medicare HMO

## 2019-07-02 ENCOUNTER — Inpatient Hospital Stay (HOSPITAL_COMMUNITY)
Admission: EM | Admit: 2019-07-02 | Discharge: 2019-07-06 | DRG: 698 | Disposition: A | Discharge status: Hospice | Payer: Medicare HMO | Source: Skilled Nursing Facility | Attending: Internal Medicine | Admitting: Internal Medicine

## 2019-07-02 ENCOUNTER — Encounter (HOSPITAL_COMMUNITY): Payer: Self-pay

## 2019-07-02 ENCOUNTER — Other Ambulatory Visit: Payer: Self-pay

## 2019-07-02 DIAGNOSIS — Z66 Do not resuscitate: Secondary | ICD-10-CM | POA: Diagnosis present

## 2019-07-02 DIAGNOSIS — Z87440 Personal history of urinary (tract) infections: Secondary | ICD-10-CM

## 2019-07-02 DIAGNOSIS — I5022 Chronic systolic (congestive) heart failure: Secondary | ICD-10-CM | POA: Diagnosis present

## 2019-07-02 DIAGNOSIS — N189 Chronic kidney disease, unspecified: Secondary | ICD-10-CM | POA: Diagnosis present

## 2019-07-02 DIAGNOSIS — I351 Nonrheumatic aortic (valve) insufficiency: Secondary | ICD-10-CM | POA: Diagnosis not present

## 2019-07-02 DIAGNOSIS — Z515 Encounter for palliative care: Secondary | ICD-10-CM | POA: Diagnosis not present

## 2019-07-02 DIAGNOSIS — R000 Tachycardia, unspecified: Secondary | ICD-10-CM | POA: Diagnosis not present

## 2019-07-02 DIAGNOSIS — N3000 Acute cystitis without hematuria: Secondary | ICD-10-CM

## 2019-07-02 DIAGNOSIS — Y846 Urinary catheterization as the cause of abnormal reaction of the patient, or of later complication, without mention of misadventure at the time of the procedure: Secondary | ICD-10-CM | POA: Diagnosis present

## 2019-07-02 DIAGNOSIS — N179 Acute kidney failure, unspecified: Secondary | ICD-10-CM | POA: Diagnosis present

## 2019-07-02 DIAGNOSIS — I429 Cardiomyopathy, unspecified: Secondary | ICD-10-CM | POA: Diagnosis present

## 2019-07-02 DIAGNOSIS — E875 Hyperkalemia: Secondary | ICD-10-CM | POA: Diagnosis present

## 2019-07-02 DIAGNOSIS — E7800 Pure hypercholesterolemia, unspecified: Secondary | ICD-10-CM | POA: Diagnosis present

## 2019-07-02 DIAGNOSIS — L89320 Pressure ulcer of left buttock, unstageable: Secondary | ICD-10-CM | POA: Diagnosis present

## 2019-07-02 DIAGNOSIS — Z8249 Family history of ischemic heart disease and other diseases of the circulatory system: Secondary | ICD-10-CM

## 2019-07-02 DIAGNOSIS — J449 Chronic obstructive pulmonary disease, unspecified: Secondary | ICD-10-CM | POA: Diagnosis present

## 2019-07-02 DIAGNOSIS — R4182 Altered mental status, unspecified: Secondary | ICD-10-CM

## 2019-07-02 DIAGNOSIS — Z95810 Presence of automatic (implantable) cardiac defibrillator: Secondary | ICD-10-CM | POA: Diagnosis not present

## 2019-07-02 DIAGNOSIS — L89310 Pressure ulcer of right buttock, unstageable: Secondary | ICD-10-CM | POA: Diagnosis present

## 2019-07-02 DIAGNOSIS — Z20822 Contact with and (suspected) exposure to covid-19: Secondary | ICD-10-CM | POA: Diagnosis present

## 2019-07-02 DIAGNOSIS — I10 Essential (primary) hypertension: Secondary | ICD-10-CM | POA: Diagnosis present

## 2019-07-02 DIAGNOSIS — N390 Urinary tract infection, site not specified: Secondary | ICD-10-CM | POA: Diagnosis present

## 2019-07-02 DIAGNOSIS — B952 Enterococcus as the cause of diseases classified elsewhere: Secondary | ICD-10-CM | POA: Diagnosis present

## 2019-07-02 DIAGNOSIS — I2510 Atherosclerotic heart disease of native coronary artery without angina pectoris: Secondary | ICD-10-CM | POA: Diagnosis present

## 2019-07-02 DIAGNOSIS — E669 Obesity, unspecified: Secondary | ICD-10-CM | POA: Diagnosis present

## 2019-07-02 DIAGNOSIS — G92 Toxic encephalopathy: Secondary | ICD-10-CM | POA: Diagnosis present

## 2019-07-02 DIAGNOSIS — Z6841 Body Mass Index (BMI) 40.0 and over, adult: Secondary | ICD-10-CM

## 2019-07-02 DIAGNOSIS — E785 Hyperlipidemia, unspecified: Secondary | ICD-10-CM | POA: Diagnosis present

## 2019-07-02 DIAGNOSIS — E861 Hypovolemia: Secondary | ICD-10-CM | POA: Diagnosis present

## 2019-07-02 DIAGNOSIS — T83518A Infection and inflammatory reaction due to other urinary catheter, initial encounter: Secondary | ICD-10-CM | POA: Diagnosis present

## 2019-07-02 DIAGNOSIS — Z960 Presence of urogenital implants: Secondary | ICD-10-CM | POA: Diagnosis not present

## 2019-07-02 DIAGNOSIS — R7881 Bacteremia: Secondary | ICD-10-CM | POA: Diagnosis present

## 2019-07-02 DIAGNOSIS — I480 Paroxysmal atrial fibrillation: Secondary | ICD-10-CM | POA: Diagnosis present

## 2019-07-02 DIAGNOSIS — N3001 Acute cystitis with hematuria: Secondary | ICD-10-CM

## 2019-07-02 DIAGNOSIS — Z951 Presence of aortocoronary bypass graft: Secondary | ICD-10-CM | POA: Diagnosis not present

## 2019-07-02 DIAGNOSIS — D539 Nutritional anemia, unspecified: Secondary | ICD-10-CM | POA: Diagnosis present

## 2019-07-02 DIAGNOSIS — I130 Hypertensive heart and chronic kidney disease with heart failure and stage 1 through stage 4 chronic kidney disease, or unspecified chronic kidney disease: Secondary | ICD-10-CM | POA: Diagnosis present

## 2019-07-02 DIAGNOSIS — L89150 Pressure ulcer of sacral region, unstageable: Secondary | ICD-10-CM | POA: Diagnosis present

## 2019-07-02 DIAGNOSIS — R471 Dysarthria and anarthria: Secondary | ICD-10-CM | POA: Diagnosis present

## 2019-07-02 DIAGNOSIS — M109 Gout, unspecified: Secondary | ICD-10-CM | POA: Diagnosis present

## 2019-07-02 DIAGNOSIS — R5381 Other malaise: Secondary | ICD-10-CM | POA: Diagnosis present

## 2019-07-02 DIAGNOSIS — I447 Left bundle-branch block, unspecified: Secondary | ICD-10-CM | POA: Diagnosis present

## 2019-07-02 DIAGNOSIS — R0602 Shortness of breath: Secondary | ICD-10-CM

## 2019-07-02 DIAGNOSIS — Z79899 Other long term (current) drug therapy: Secondary | ICD-10-CM

## 2019-07-02 DIAGNOSIS — G252 Other specified forms of tremor: Secondary | ICD-10-CM | POA: Diagnosis present

## 2019-07-02 LAB — COMPREHENSIVE METABOLIC PANEL
ALT: 27 U/L (ref 0–44)
AST: 44 U/L — ABNORMAL HIGH (ref 15–41)
Albumin: 3.1 g/dL — ABNORMAL LOW (ref 3.5–5.0)
Alkaline Phosphatase: 64 U/L (ref 38–126)
Anion gap: 11 (ref 5–15)
BUN: 30 mg/dL — ABNORMAL HIGH (ref 8–23)
CO2: 30 mmol/L (ref 22–32)
Calcium: 9.5 mg/dL (ref 8.9–10.3)
Chloride: 99 mmol/L (ref 98–111)
Creatinine, Ser: 1.6 mg/dL — ABNORMAL HIGH (ref 0.44–1.00)
GFR calc Af Amer: 38 mL/min — ABNORMAL LOW (ref >60)
GFR calc non Af Amer: 33 mL/min — ABNORMAL LOW (ref >60)
Glucose, Bld: 92 mg/dL (ref 70–99)
Potassium: 5.6 mmol/L — ABNORMAL HIGH (ref 3.5–5.1)
Sodium: 140 mmol/L (ref 135–145)
Total Bilirubin: 0.6 mg/dL (ref 0.3–1.2)
Total Protein: 6.6 g/dL (ref 6.5–8.1)

## 2019-07-02 LAB — URINALYSIS, ROUTINE W REFLEX MICROSCOPIC
Bilirubin Urine: NEGATIVE
Glucose, UA: NEGATIVE mg/dL
Ketones, ur: NEGATIVE mg/dL
Nitrite: NEGATIVE
Protein, ur: NEGATIVE mg/dL
RBC / HPF: >50 RBC/hpf — ABNORMAL HIGH (ref 0–5)
Specific Gravity, Urine: 1.015 (ref 1.005–1.030)
WBC, UA: >50 WBC/hpf — ABNORMAL HIGH (ref 0–5)
pH: 6 (ref 5.0–8.0)

## 2019-07-02 LAB — CBC WITH DIFFERENTIAL/PLATELET
Abs Immature Granulocytes: 0.06 10*3/uL (ref 0.00–0.07)
Basophils Absolute: 0.1 10*3/uL (ref 0.0–0.1)
Basophils Relative: 1 %
Eosinophils Absolute: 0 10*3/uL (ref 0.0–0.5)
Eosinophils Relative: 0 %
HCT: 40.2 % (ref 36.0–46.0)
Hemoglobin: 12.1 g/dL (ref 12.0–15.0)
Immature Granulocytes: 1 %
Lymphocytes Relative: 17 %
Lymphs Abs: 1.6 10*3/uL (ref 0.7–4.0)
MCH: 32.4 pg (ref 26.0–34.0)
MCHC: 30.1 g/dL (ref 30.0–36.0)
MCV: 107.5 fL — ABNORMAL HIGH (ref 80.0–100.0)
Monocytes Absolute: 1.3 10*3/uL — ABNORMAL HIGH (ref 0.1–1.0)
Monocytes Relative: 13 %
Neutro Abs: 6.6 10*3/uL (ref 1.7–7.7)
Neutrophils Relative %: 68 %
Platelets: 190 10*3/uL (ref 150–400)
RBC: 3.74 MIL/uL — ABNORMAL LOW (ref 3.87–5.11)
RDW: 14.9 % (ref 11.5–15.5)
WBC: 9.5 10*3/uL (ref 4.0–10.5)
nRBC: 0.3 % — ABNORMAL HIGH (ref 0.0–0.2)

## 2019-07-02 LAB — POC SARS CORONAVIRUS 2 AG -  ED: SARS Coronavirus 2 Ag: NEGATIVE

## 2019-07-02 LAB — LACTIC ACID, PLASMA: Lactic Acid, Venous: 1.7 mmol/L (ref 0.5–1.9)

## 2019-07-02 MED ORDER — SODIUM CHLORIDE 0.9 % IV BOLUS
500.0000 mL | Freq: Once | INTRAVENOUS | Status: AC
  Administered 2019-07-02: 21:00:00 500 mL via INTRAVENOUS

## 2019-07-02 MED ORDER — SODIUM CHLORIDE 0.9 % IV SOLN
3.0000 g | Freq: Once | INTRAVENOUS | Status: AC
  Administered 2019-07-02: 19:00:00 3 g via INTRAVENOUS
  Filled 2019-07-02: qty 8

## 2019-07-02 MED ORDER — UMECLIDINIUM BROMIDE 62.5 MCG/INH IN AEPB
1.0000 | INHALATION_SPRAY | Freq: Every day | RESPIRATORY_TRACT | Status: DC
  Administered 2019-07-03: 1 via RESPIRATORY_TRACT
  Filled 2019-07-02: qty 7

## 2019-07-02 MED ORDER — ONDANSETRON HCL 4 MG PO TABS: 4.0000 mg | ORAL_TABLET | Freq: Four times a day (QID) | ORAL | Status: DC | PRN

## 2019-07-02 MED ORDER — SODIUM CHLORIDE 0.9% FLUSH
3.0000 mL | Freq: Two times a day (BID) | INTRAVENOUS | Status: DC
  Administered 2019-07-03 – 2019-07-05 (×6): 3 mL via INTRAVENOUS

## 2019-07-02 MED ORDER — LACTATED RINGERS IV SOLN: INTRAVENOUS | Status: DC

## 2019-07-02 MED ORDER — PANTOPRAZOLE SODIUM 40 MG PO TBEC
40.0000 mg | DELAYED_RELEASE_TABLET | Freq: Every day | ORAL | Status: DC
  Administered 2019-07-03 – 2019-07-06 (×4): 40 mg via ORAL
  Filled 2019-07-02 (×4): qty 1

## 2019-07-02 MED ORDER — VENLAFAXINE HCL ER 150 MG PO CP24
150.0000 mg | ORAL_CAPSULE | Freq: Every day | ORAL | Status: DC
  Administered 2019-07-03: 150 mg via ORAL
  Filled 2019-07-02: qty 1

## 2019-07-02 MED ORDER — ACETAMINOPHEN 650 MG RE SUPP: 650.0000 mg | Freq: Four times a day (QID) | RECTAL | Status: DC | PRN

## 2019-07-02 MED ORDER — ALLOPURINOL 300 MG PO TABS
300.0000 mg | ORAL_TABLET | Freq: Every day | ORAL | Status: DC
  Administered 2019-07-03 – 2019-07-06 (×4): 300 mg via ORAL
  Filled 2019-07-02 (×4): qty 1

## 2019-07-02 MED ORDER — SODIUM CHLORIDE 0.9% FLUSH
3.0000 mL | Freq: Once | INTRAVENOUS | Status: AC
  Administered 2019-07-02: 17:00:00 3 mL via INTRAVENOUS

## 2019-07-02 MED ORDER — SODIUM BICARBONATE 8.4 % IV SOLN
50.0000 meq | Freq: Once | INTRAVENOUS | Status: AC
  Administered 2019-07-02: 21:00:00 50 meq via INTRAVENOUS
  Filled 2019-07-02: qty 50

## 2019-07-02 MED ORDER — MOMETASONE FURO-FORMOTEROL FUM 100-5 MCG/ACT IN AERO
2.0000 | INHALATION_SPRAY | Freq: Two times a day (BID) | RESPIRATORY_TRACT | Status: DC
  Administered 2019-07-03 – 2019-07-06 (×7): 2 via RESPIRATORY_TRACT
  Filled 2019-07-02: qty 8.8

## 2019-07-02 MED ORDER — ONDANSETRON HCL 4 MG/2ML IJ SOLN: 4.0000 mg | Freq: Four times a day (QID) | INTRAMUSCULAR | Status: DC | PRN

## 2019-07-02 MED ORDER — ACETAMINOPHEN 650 MG RE SUPP
650.0000 mg | Freq: Once | RECTAL | Status: AC
  Administered 2019-07-02: 18:00:00 650 mg via RECTAL
  Filled 2019-07-02: qty 1

## 2019-07-02 MED ORDER — CALCIUM GLUCONATE 10 % IV SOLN: 1.0000 g | Freq: Once | INTRAVENOUS | Status: DC

## 2019-07-02 MED ORDER — ROSUVASTATIN CALCIUM 20 MG PO TABS
40.0000 mg | ORAL_TABLET | Freq: Every evening | ORAL | Status: DC
  Administered 2019-07-03: 18:00:00 40 mg via ORAL
  Filled 2019-07-02: qty 2

## 2019-07-02 MED ORDER — DABIGATRAN ETEXILATE MESYLATE 150 MG PO CAPS
150.0000 mg | ORAL_CAPSULE | Freq: Two times a day (BID) | ORAL | Status: DC
  Administered 2019-07-03: 06:00:00 150 mg via ORAL
  Filled 2019-07-02 (×2): qty 1

## 2019-07-02 MED ORDER — SODIUM CHLORIDE 0.9 % IV SOLN
3.0000 g | Freq: Four times a day (QID) | INTRAVENOUS | Status: DC
  Administered 2019-07-03 (×2): 3 g via INTRAVENOUS
  Filled 2019-07-02 (×2): qty 8
  Filled 2019-07-02: qty 3

## 2019-07-02 MED ORDER — BISACODYL 10 MG RE SUPP: 10.0000 mg | Freq: Every day | RECTAL | Status: DC | PRN

## 2019-07-02 MED ORDER — CARVEDILOL 3.125 MG PO TABS
3.1250 mg | ORAL_TABLET | Freq: Every day | ORAL | Status: DC
  Administered 2019-07-03 – 2019-07-05 (×2): 3.125 mg via ORAL
  Filled 2019-07-02 (×3): qty 1

## 2019-07-02 MED ORDER — SODIUM CHLORIDE 0.9 % IV BOLUS
500.0000 mL | Freq: Once | INTRAVENOUS | Status: AC
  Administered 2019-07-02: 18:00:00 500 mL via INTRAVENOUS

## 2019-07-02 MED ORDER — CALCIUM GLUCONATE-NACL 1-0.675 GM/50ML-% IV SOLN
1.0000 g | Freq: Once | INTRAVENOUS | Status: AC
  Administered 2019-07-02: 1000 mg via INTRAVENOUS
  Filled 2019-07-02: qty 50

## 2019-07-02 MED ORDER — ACETAMINOPHEN 325 MG PO TABS
650.0000 mg | ORAL_TABLET | Freq: Four times a day (QID) | ORAL | Status: DC | PRN
  Administered 2019-07-03: 650 mg via ORAL
  Filled 2019-07-02: qty 2

## 2019-07-02 MED ORDER — POLYETHYLENE GLYCOL 3350 17 G PO PACK: 17.0000 g | PACK | Freq: Every day | ORAL | Status: DC | PRN

## 2019-07-02 NOTE — ED Provider Notes (Signed)
[facility]-EMERGENCY DEPT Provider Note   CSN: [id number] Arrival date & time: 07/02/19  1620     History Chief Complaint  Patient presents with  . AMS   Level 5 caveat due to altered mental status  Karen Stanley is a 69 y.o. female with history of CHF, COPD, CKD, CAD, hypertension, paroxysmal A. fib presents sent from [facility] for evaluation of acute onset, persistent altered mental status.  Per EMS the patient is typically A&O x4 but today was found to be confused.  Her daughter has been attempting to reach the patient but has not been able to speak with her for a couple of days.  Patient has indwelling Foley catheter and facility noted decreased urine output.  On my assessment the patient appears drowsy but easily arousable.  She is oriented to person and year but otherwise has difficulty answering questions.  She tells me she does not have any pain.  Speech is dysarthric.  She is DNR.   The history is provided by the patient, medical records, the EMS personnel and a relative. The history is limited by the condition of the patient.       Past Medical History:  Diagnosis Date  . CHF (congestive heart failure) (HCC)   . Chronic kidney disease   . COPD (chronic obstructive pulmonary disease) (HCC)   . Coronary artery disease   . Hypertension     Patient Active Problem List   Diagnosis Date Noted  . UTI (urinary tract infection) 07/02/2019  . Pressure injury of skin 05/23/2019  . SIRS (systemic inflammatory response syndrome) (HCC) 05/22/2019  . PAF (paroxysmal atrial fibrillation) (HCC) 05/22/2019  . Cellulitis 05/22/2019  . Cardiomyopathy (HCC) 30-35 % EF 11/20 05/22/2019  . HYPERCHOLESTEROLEMIA 02/26/2010  . TOBACCO ABUSE 02/26/2010  . Essential hypertension 02/26/2010  . Coronary atherosclerosis 02/26/2010  . EMPHYSEMA 02/26/2010  . COPD 02/26/2010  . GERD 02/26/2010    Past Surgical History:  Procedure Laterality Date  . CARDIAC  DEFIBRILLATOR PLACEMENT    . CORONARY ARTERY BYPASS GRAFT  2011     OB History   No obstetric history on file.     Family History  Problem Relation Age of Onset  . Heart disease Mother   . Hypertension Father   . Cancer Sister     Social History   Tobacco Use  . Smoking status: Never Smoker  . Smokeless tobacco: Never Used  Substance Use Topics  . Alcohol use: Never  . Drug use: Never    Home Medications Prior to Admission medications   Medication Sig Start Date End Date Taking? Authorizing Provider  allopurinol (ZYLOPRIM) 300 MG tablet Take 300 mg by mouth daily.   Yes [provider]  carvedilol (COREG) 3.125 MG tablet Take 3.125 mg by mouth at bedtime.   Yes [provider]  dabigatran (PRADAXA) 150 MG CAPS capsule Take 150 mg by mouth 2 (two) times daily.   Yes [provider]  furosemide (LASIX) 20 MG tablet Take 20 mg by mouth daily.   Yes [provider]  gabapentin (NEURONTIN) 600 MG tablet Take 600 mg by mouth 2 (two) times daily.    Yes [provider]  isosorbide mononitrate (IMDUR) 30 MG 24 hr tablet Take 30 mg by mouth daily.   Yes [provider]  Menthol, Topical Analgesic, (BIOFREEZE) 4 % GEL Apply 1 application topically 3 (three) times daily. To bilateral shoulders   Yes [provider]  mometasone-formoterol [facility])  100-5 MCG/ACT AERO Inhale 2 puffs into the lungs 2 (two) times daily. 05/29/19  Yes Sunnie Nielsen, DO  Multiple Vitamin (MULTIVITAMIN WITH MINERALS) TABS tablet Take 1 tablet by mouth daily. 05/30/19  Yes Sunnie Nielsen, DO  nystatin cream (MYCOSTATIN) Apply 1 application topically 2 (two) times daily. Apply to entire genitalia area   Yes [provider]  ondansetron (ZOFRAN) 4 MG tablet Take 1 tablet (4 mg total) by mouth every 6 (six) hours as needed for nausea. 05/29/19  Yes Sunnie Nielsen, DO  pantoprazole (PROTONIX) 40 MG tablet Take 40 mg by mouth daily.   Yes  [provider]  potassium chloride SA (KLOR-CON) 20 MEQ tablet Take 40 mEq by mouth daily.   Yes [provider]  Probiotic Product (PROBIOTIC PO) Take 1 capsule by mouth 2 (two) times daily. Lactobacillus combination no.4, 3 billion cell   Yes [provider]  rosuvastatin (CRESTOR) 40 MG tablet Take 40 mg by mouth every evening.    Yes [provider]  umeclidinium bromide (INCRUSE ELLIPTA) 62.5 MCG/INH AEPB Inhale 1 puff into the lungs daily. 05/30/19  Yes Sunnie Nielsen, DO  venlafaxine XR (EFFEXOR-XR) 150 MG 24 hr capsule Take 150 mg by mouth daily with breakfast.   Yes [provider]  Amino Acids-Protein Hydrolys (FEEDING SUPPLEMENT, PRO-STAT SUGAR FREE 64,) LIQD Take 30 mLs by mouth 3 (three) times daily. Patient not taking: Reported on 07/02/2019 05/29/19   Sunnie Nielsen, DO  cefTRIAXone (ROCEPHIN) 1 g injection Inject 1 g into the muscle daily.    [provider]  Hydrocortisone (GERHARDT'S BUTT CREAM) CREA Apply 1 application topically 3 (three) times daily. Patient not taking: Reported on 07/02/2019 05/29/19   Sunnie Nielsen, DO    Allergies    Patient has no known allergies.  Review of Systems   Review of Systems  Unable to perform ROS: Mental status change    Physical Exam Updated Vital Signs BP (!) 116/91   Pulse 93   Temp 99.9 F (37.7 C) (Rectal)   Resp 17   SpO2 94%   Physical Exam Vitals and nursing note reviewed.  Constitutional:      General: She is not in acute distress.    Appearance: She is well-developed. She is obese. She is ill-appearing.  HENT:     Head: Normocephalic and atraumatic.     Mouth/Throat:     Mouth: Mucous membranes are dry.  Eyes:     General:        Right eye: No discharge.        Left eye: No discharge.     Conjunctiva/sclera: Conjunctivae normal.  Neck:     Vascular: No JVD.     Trachea: No tracheal deviation.  Cardiovascular:     Rate and Rhythm: Regular rhythm.  Tachycardia present.     Pulses: Normal pulses.  Pulmonary:     Effort: Pulmonary effort is normal.     Comments: Scattered rails.  SpO2 saturations 100% on 3 L supplemental oxygen via nasal cannula Abdominal:     General: Bowel sounds are normal. There is no distension.     Palpations: Abdomen is soft.     Tenderness: There is no abdominal tenderness. There is no guarding or rebound.     Comments: Indwelling foley catheter. Nonbloody stool noted around the labia and urethra  Genitourinary:    Comments: Decubitus ulcer with overlying dressing Musculoskeletal:     Cervical back: Neck supple.  Skin:    General:  Skin is warm and dry.     Findings: No erythema.  Neurological:     Mental Status: She is alert.     Comments: Speech is dysarthric.  Patient oriented to person and year only.  No cranial nerve deficit noted.  Good grip strength bilaterally.  Follows most commands.  Psychiatric:        Behavior: Behavior normal.     ED Results / Procedures / Treatments   Labs (all labs ordered are listed, but only abnormal results are displayed) Labs Reviewed  COMPREHENSIVE METABOLIC PANEL - Abnormal; Notable for the following components:      Result Value   Potassium 5.6 (*)    BUN 30 (*)    Creatinine, Ser 1.60 (*)    Albumin 3.1 (*)    AST 44 (*)    GFR calc non Af Amer 33 (*)    GFR calc Af Amer 38 (*)    All other components within normal limits  CBC WITH DIFFERENTIAL/PLATELET - Abnormal; Notable for the following components:   RBC 3.74 (*)    MCV 107.5 (*)    nRBC 0.3 (*)    Monocytes Absolute 1.3 (*)    All other components within normal limits  URINALYSIS, ROUTINE W REFLEX MICROSCOPIC - Abnormal; Notable for the following components:   APPearance CLOUDY (*)    Hgb urine dipstick SMALL (*)    Leukocytes,Ua LARGE (*)    RBC / HPF >50 (*)    WBC, UA >50 (*)    Bacteria, UA MANY (*)    Non Squamous Epithelial 0-5 (*)    All other components within normal limits  CULTURE,  BLOOD (ROUTINE X 2)  CULTURE, BLOOD (ROUTINE X 2)  URINE CULTURE  SARS CORONAVIRUS 2 (TAT 6-24 HRS)  LACTIC ACID, PLASMA  LACTIC ACID, PLASMA  POC SARS CORONAVIRUS 2 AG -  ED    EKG EKG Interpretation  Date/Time:  Sunday July 02 2019 16:38:28 EST Ventricular Rate:  99 PR Interval:    QRS Duration: 129 QT Interval:  352 QTC Calculation: 452 R Axis:   -10 Text Interpretation: Sinus rhythm Left bundle branch block Confirmed by Raeford Razor [phone]) on 07/02/2019 6:01:09 PM   Radiology CT Head Wo Contrast  Result Date: 07/02/2019 CLINICAL DATA:  Altered mental status. EXAM: CT HEAD WITHOUT CONTRAST TECHNIQUE: Contiguous axial images were obtained from the base of the skull through the vertex without intravenous contrast. COMPARISON:  None. FINDINGS: Brain: There is mild cerebral atrophy with widening of the extra-axial spaces and ventricular dilatation. There are areas of decreased attenuation within the white matter tracts of the supratentorial brain, consistent with microvascular disease changes. Vascular: No hyperdense vessel or unexpected calcification. Skull: Normal. Negative for fracture or focal lesion. Sinuses/Orbits: No acute finding. Other: None. IMPRESSION: No acute intracranial pathology. Electronically Signed   By: Aram Candela M.D.   On: 07/02/2019 19:02   DG Chest Port 1 View  Result Date: 07/02/2019 CLINICAL DATA:  Shortness of breath. EXAM: PORTABLE CHEST 1 VIEW COMPARISON:  May 22, 2019 FINDINGS: There is a dual lead AICD. Multiple sternal wires and vascular clips are seen. There is no evidence of acute infiltrate, pleural effusion or pneumothorax. The cardiac silhouette is mildly enlarged and unchanged in size. The visualized skeletal structures are unremarkable. IMPRESSION: 1. Evidence of prior median sternotomy/CABG. 2. Stable cardiomegaly. 3. No acute or active cardiopulmonary disease. Electronically Signed   By: Aram Candela M.D.   On: 07/02/2019 17:42  Procedures .Critical Care Performed by: Jeanie Sewer, PA-C Authorized by: Jeanie Sewer, PA-C   Critical care provider statement:    Critical care time (minutes):  45   Critical care was necessary to treat or prevent imminent or life-threatening deterioration of the following conditions:  Renal failure   Critical care was time spent personally by me on the following activities:  Discussions with consultants, evaluation of patient's response to treatment, examination of patient, ordering and performing treatments and interventions, ordering and review of laboratory studies, ordering and review of radiographic studies, pulse oximetry, re-evaluation of patient's condition, obtaining history from patient or surrogate and review of old charts   (including critical care time)  Medications Ordered in ED Medications  calcium gluconate 1 g/ 50 mL sodium chloride IVPB (1,000 mg Intravenous New Bag/Given 07/02/19 2054)  Ampicillin-Sulbactam (UNASYN) 3 g in sodium chloride 0.9 % 100 mL IVPB (has no administration in time range)  sodium chloride flush (NS) 0.9 % injection 3 mL (3 mLs Intravenous Given 07/02/19 1719)  acetaminophen (TYLENOL) suppository 650 mg (650 mg Rectal Given 07/02/19 1739)  sodium chloride 0.9 % bolus 500 mL (0 mLs Intravenous Stopped 07/02/19 2013)  Ampicillin-Sulbactam (UNASYN) 3 g in sodium chloride 0.9 % 100 mL IVPB (0 g Intravenous Stopped 07/02/19 2013)  sodium chloride 0.9 % bolus 500 mL (500 mLs Intravenous New Bag/Given 07/02/19 2054)  sodium bicarbonate injection 50 mEq (50 mEq Intravenous Given 07/02/19 2045)    ED Course  I have reviewed the triage vital signs and the nursing notes.  Pertinent labs & imaging results that were available during my care of the patient were reviewed by me and considered in my medical decision making (see chart for details).    MDM Rules/Calculators/A&P                      Patient presenting sent from [facility] for evaluation of altered  mental status.  She is febrile, presents on 3 L supplemental oxygen via nasal cannula they were the reason is unclear as it does not appear that she is typically on supplemental oxygen.  She is oriented to person and place only.  Moves extremities spontaneously.  No focal neurologic deficits noted but appears clinically dry and generally weak.  There is stool around the patient's indwelling Foley catheter site.  On chart review it appears she was meant to have this discontinued after her recent admission in January but this may not have occurred.  5:45PM Spoke with patient's daughter on the phone. Patient texted daughter Wednesday, last spoke Wednesday and Thursday. Typically A&Ox4, in the last couple weeks daughter has noted she has been intermittently confused.   5:50 PM Attempted to call [facility] for collateral. Phone rang and rang without anyone answering.   Lab work reviewed by me shows no leukocytosis, no anemia.  She does appear to have an AKI with elevation in her BUN and creatinine above baseline.  Her potassium is elevated at 5.6.  Her EKG shows left bundle branch block, no acute ischemic abnormalities and QT interval is within normal limits.  We will give sodium bicarb and calcium gluconate for management of hyperkalemia.  She was also given IV fluid bolus but not 30 cc/kg as she has been maintaining blood pressures and with history of CHF I do not wish to volume overload her. Chest x-ray shows no acute cardiopulmonary abnormalities.  Head CT shows no acute intracranial abnormalities.  UA suggestive of UTI.  Her last urine culture was sensitive to ampicillin so she was given a dose of IV Unasyn in the ED.  Point-of-care Covid test is negative. Spoke with Dr. Mindi Junker with [facility] hospitalist service who agrees to assume care of patient and bring her into the hospital for further evaluation and management.  Final Clinical Impression(s) / ED Diagnoses Final diagnoses:  AKI (acute kidney injury)  ([place])  Acute cystitis with hematuria  Altered mental status, unspecified altered mental status type    Rx / DC Orders ED Discharge Orders    None       Debroah Baller 07/02/19 2153    Virgel Manifold, MD 07/04/19 [phone]

## 2019-07-02 NOTE — Progress Notes (Signed)
Pharmacy Antibiotic Note  Karen Stanley is a 69 y.o. female admitted on 07/02/2019 with UTI.  She has indwelling foley catheter and completed 7 day course Rocephin IM without improvement.  12/28 Urine cx + VRE sensitive to ampicillin.  Pharmacy has been consulted for Unasyn dosing.  Tm 101.72F, WBC WNL, Scr elevated above baseline, CrCl ~ 69ml/min  Plan: Unasyn 3gm IV q6h No dose adjustments anticipated.  Pharmacy will sign off & monitor peripherally for renal function changed and cx data.  Please re-consult if needed.      Temp (24hrs), Avg:101.6 F (38.7 C), Min:101.6 F (38.7 C), Max:101.6 F (38.7 C)  Recent Labs  Lab 07/02/19 1658  WBC 9.5  CREATININE 1.60*  LATICACIDVEN 1.7    CrCl cannot be calculated (Unknown ideal weight.).    No Known Allergies  Thank you for allowing pharmacy to be a part of this patient's care.  [name], PharmD, BCPS 07/02/2019 8:32 PM

## 2019-07-02 NOTE — ED Notes (Signed)
Patients belongings placed in belongings at bedside.  1 bag- shirt and cell phone

## 2019-07-02 NOTE — ED Triage Notes (Signed)
Pt arrive via [facility] from [facility] CC AMS. Per EMS arrousable to voice A/OX2 GCS 10. Per pts daughter pt is normally A/OX4 and has had difficulty reaching pt at facility. Foley in place with report decrease urine output.   22G left hand 300 ml Ns given in route. Unsure if on baseline oxygen at home 99% 3L per EMS  Hx possible UTI  End of January   DNR MOST FORM AT BEDSIDE

## 2019-07-02 NOTE — H&P (Signed)
History and Physical    Karen Stanley PNT:[id number] DOB: Sep 30, 1950 DOA: 07/02/2019  PCP: System, Pcp Not In  Patient coming from: [facility] facility  I have personally briefly reviewed patient's old medical records in [facility]  Chief Complaint: Altered mental status  HPI: [name] is a 69 y.o. female with medical history significant for heart failure with reduced ejection fraction (30 to 35% November 2020), COPD, CAD status post CABG 2011, hypertension paroxysmal A. fib on Pradaxa who presents from her skilled nursing facility with chief complaint of altered mental status, found to have a UTI.   My history is limited secondary to the patient's altered mental status on arrival, though supplemented with records from her facility as well as information from her daughter, [name] reachable at [phone].  Facility called EMS due to decreased mental status, reportedly at the time of EMS arrival patient is AAO x2 and GCS of 10.  Reportedly, anticomplement) AOx4.  Patient has indwelling Foley in place with decreased urine output over the last few days.  Denies pain.  Of note, patient recently admitted 12/28 to 05/29/2019 for cellulitis and was discharged to [facility] with foley catheter in place for incontinence.  There, it seems like she has overall improved, except for on 1/28 when she was noted to have a UTI and treated with Rocephin for 5 days, ending 2/3.  EMS administered 300 cc normal saline in route, saturating 99% on 3 L.  Arrived with DNR/MOST form.  ED Course: On arrival to the ED pt was febrile to 101.6, 81 bpm, RR 27, 137/77, saturating percent 3 L, though drowsy, oriented to person and year otherwise has difficulty answering questions.  Denies pain.. Labs and imaging studies acquired. Initial labwork notable for WBC 9.5, hemoglobin 12.1 with an MCV of 107, potassium 5.6, creatinine 1.6, BUN 30.  Baseline creatinine appears around 0.7.  Lactic acid 1.7.  Urinalysis with  yeast, squamous epithelial cells, bacteria, WBC, leukocyte.  Blood and urine cultures collected.  Imaging studies notable for no acute cardiopulmonary disease noted on chest x-ray.  CT head without acute intracranial pathology.  Patient was administered Unasyn. Given concern for UTI, decision was made to admit.   Review of Systems: As per HPI otherwise 10 point review of systems negative.    Past Medical History:  Diagnosis Date  . CHF (congestive heart failure) (HCC)   . Chronic kidney disease   . COPD (chronic obstructive pulmonary disease) (HCC)   . Coronary artery disease   . Hypertension     Past Surgical History:  Procedure Laterality Date  . CARDIAC DEFIBRILLATOR PLACEMENT    . CORONARY ARTERY BYPASS GRAFT  2011     reports that she has never smoked. She has never used smokeless tobacco. She reports that she does not drink alcohol or use drugs.  No Known Allergies  Family History  Problem Relation Age of Onset  . Heart disease Mother   . Hypertension Father   . Cancer Sister     Prior to Admission medications   Medication Sig Start Date End Date Taking? Authorizing Provider  allopurinol (ZYLOPRIM) 300 MG tablet Take 300 mg by mouth daily.   Yes [provider]  carvedilol (COREG) 3.125 MG tablet Take 3.125 mg by mouth at bedtime.   Yes [provider]  dabigatran (PRADAXA) 150 MG CAPS capsule Take 150 mg by mouth 2 (two) times daily.   Yes [provider]  furosemide (LASIX) 20 MG tablet Take 20  mg by mouth daily.   Yes [provider]  gabapentin (NEURONTIN) 600 MG tablet Take 600 mg by mouth 2 (two) times daily.    Yes [provider]  isosorbide mononitrate (IMDUR) 30 MG 24 hr tablet Take 30 mg by mouth daily.   Yes [provider]  Menthol, Topical Analgesic, (BIOFREEZE) 4 % GEL Apply 1 application topically 3 (three) times daily. To bilateral shoulders   Yes [provider]  mometasone-formoterol  (DULERA) 100-5 MCG/ACT AERO Inhale 2 puffs into the lungs 2 (two) times daily. 05/29/19  Yes Sunnie Nielsen, DO  Multiple Vitamin (MULTIVITAMIN WITH MINERALS) TABS tablet Take 1 tablet by mouth daily. 05/30/19  Yes Sunnie Nielsen, DO  nystatin cream (MYCOSTATIN) Apply 1 application topically 2 (two) times daily. Apply to entire genitalia area   Yes [provider]  ondansetron (ZOFRAN) 4 MG tablet Take 1 tablet (4 mg total) by mouth every 6 (six) hours as needed for nausea. 05/29/19  Yes Sunnie Nielsen, DO  pantoprazole (PROTONIX) 40 MG tablet Take 40 mg by mouth daily.   Yes [provider]  potassium chloride SA (KLOR-CON) 20 MEQ tablet Take 40 mEq by mouth daily.   Yes [provider]  Probiotic Product (PROBIOTIC PO) Take 1 capsule by mouth 2 (two) times daily. Lactobacillus combination no.4, 3 billion cell   Yes [provider]  rosuvastatin (CRESTOR) 40 MG tablet Take 40 mg by mouth every evening.    Yes [provider]  umeclidinium bromide (INCRUSE ELLIPTA) 62.5 MCG/INH AEPB Inhale 1 puff into the lungs daily. 05/30/19  Yes Sunnie Nielsen, DO  venlafaxine XR (EFFEXOR-XR) 150 MG 24 hr capsule Take 150 mg by mouth daily with breakfast.   Yes [provider]  Amino Acids-Protein Hydrolys (FEEDING SUPPLEMENT, PRO-STAT SUGAR FREE 64,) LIQD Take 30 mLs by mouth 3 (three) times daily. Patient not taking: Reported on 07/02/2019 05/29/19   Sunnie Nielsen, DO  cefTRIAXone (ROCEPHIN) 1 g injection Inject 1 g into the muscle daily.    [provider]  Hydrocortisone (GERHARDT'S BUTT CREAM) CREA Apply 1 application topically 3 (three) times daily. Patient not taking: Reported on 07/02/2019 05/29/19   Sunnie Nielsen, DO    Physical Exam: Vitals:   07/02/19 1759 07/02/19 1800 07/02/19 1830 07/02/19 1900  BP:  137/84 128/81 137/77  Pulse: 96 95 95 91  Resp: 20 19 20  (!) 27  Temp:      TempSrc:      SpO2: 100% 100% 100% 100%    Constitutional: NAD, calm, comfortable Eyes: PERRL, lids and conjunctivae normal ENMT: Mucous membranes are moist. Posterior pharynx clear of any exudate or lesions. Neck: normal, supple, no masses, no thyromegaly Respiratory: clear to auscultation bilaterally, no wheezing, no crackles. Normal respiratory effort. No accessory muscle use.  Cardiovascular: Regular rate and rhythm, no murmurs / rubs / gallops. No extremity edema. 2+ pedal pulses. No carotid bruits.  Abdomen: no tenderness, no masses palpated. No hepatosplenomegaly. Bowel sounds positive.  Musculoskeletal: no clubbing / cyanosis. No joint deformity upper and lower extremities. Good ROM, no contractures. Normal muscle tone.  Skin: no rashes, lesions, ulcers. No induration Neurologic: CN 2-12 grossly intact. Sensation intact. Strength 5/5 in all 4.  Psychiatric: Normal judgment and insight. Alert and oriented x 3. Normal mood.   Labs on Admission: I have personally reviewed following labs and imaging studies  CBC: Recent Labs  Lab 07/02/19 1658  WBC 9.5  NEUTROABS 6.6  HGB 12.1  HCT 40.2  MCV 107.5*  PLT 628   Basic Metabolic Panel: Recent Labs  Lab 07/02/19 1658  NA 140  K 5.6*  CL 99  CO2 30  GLUCOSE 92  BUN 30*  CREATININE 1.60*  CALCIUM 9.5   GFR: CrCl cannot be calculated (Unknown ideal weight.). Liver Function Tests: Recent Labs  Lab 07/02/19 1658  AST 44*  ALT 27  ALKPHOS 64  BILITOT 0.6  PROT 6.6  ALBUMIN 3.1*   No results for input(s): LIPASE, AMYLASE in the last 168 hours. No results for input(s): AMMONIA in the last 168 hours. Coagulation Profile: No results for input(s): INR, PROTIME in the last 168 hours. Cardiac Enzymes: No results for input(s): CKTOTAL, CKMB, CKMBINDEX, TROPONINI in the last 168 hours. BNP (last 3 results) No results for input(s): PROBNP in the last 8760 hours. HbA1C: No results for input(s): HGBA1C in the last 72 hours. CBG: No results for input(s): GLUCAP  in the last 168 hours. Lipid Profile: No results for input(s): CHOL, HDL, LDLCALC, TRIG, CHOLHDL, LDLDIRECT in the last 72 hours. Thyroid Function Tests: No results for input(s): TSH, T4TOTAL, FREET4, T3FREE, THYROIDAB in the last 72 hours. Anemia Panel: No results for input(s): VITAMINB12, FOLATE, FERRITIN, TIBC, IRON, RETICCTPCT in the last 72 hours. Urine analysis:    Component Value Date/Time   COLORURINE YELLOW 07/02/2019 1641   APPEARANCEUR CLOUDY (A) 07/02/2019 1641   LABSPEC 1.015 07/02/2019 1641   PHURINE 6.0 07/02/2019 1641   GLUCOSEU NEGATIVE 07/02/2019 1641   HGBUR SMALL (A) 07/02/2019 1641   BILIRUBINUR NEGATIVE 07/02/2019 1641   KETONESUR NEGATIVE 07/02/2019 1641   PROTEINUR NEGATIVE 07/02/2019 1641   UROBILINOGEN 0.2 02/03/2010 2339   NITRITE NEGATIVE 07/02/2019 1641   LEUKOCYTESUR LARGE (A) 07/02/2019 1641    Radiological Exams on Admission: CT Head Wo Contrast  Result Date: 07/02/2019 CLINICAL DATA:  Altered mental status. EXAM: CT HEAD WITHOUT CONTRAST TECHNIQUE: Contiguous axial images were obtained from the base of the skull through the vertex without intravenous contrast. COMPARISON:  None. FINDINGS: Brain: There is mild cerebral atrophy with widening of the extra-axial spaces and ventricular dilatation. There are areas of decreased attenuation within the white matter tracts of the supratentorial brain, consistent with microvascular disease changes. Vascular: No hyperdense vessel or unexpected calcification. Skull: Normal. Negative for fracture or focal lesion. Sinuses/Orbits: No acute finding. Other: None. IMPRESSION: No acute intracranial pathology. Electronically Signed   By: Virgina Norfolk M.D.   On: 07/02/2019 19:02   DG Chest Port 1 View  Result Date: 07/02/2019 CLINICAL DATA:  Shortness of breath. EXAM: PORTABLE CHEST 1 VIEW COMPARISON:  May 22, 2019 FINDINGS: There is a dual lead AICD. Multiple sternal wires and vascular clips are seen. There is no  evidence of acute infiltrate, pleural effusion or pneumothorax. The cardiac silhouette is mildly enlarged and unchanged in size. The visualized skeletal structures are unremarkable. IMPRESSION: 1. Evidence of prior median sternotomy/CABG. 2. Stable cardiomegaly. 3. No acute or active cardiopulmonary disease. Electronically Signed   By: Virgina Norfolk M.D.   On: 07/02/2019 17:42    EKG: Independently reviewed.  Sinus rhythm, rate 83 with left bundle morphology, appears to be RV paced rhythm.  Assessment/Plan Active Problems:   UTI (urinary tract infection)   UTI -Patient arriving confused, febrile with patient with Foley catheter in for seemingly over a month now, UA concerning for infection -Culture pending, status post 1 dose Unasyn in ED, which based on prior culture data of Enterococcus species sensitivities should be an appropriate  choice for her pending development of any new multidrug-resistant infection, which is increased in likelihood given her skilled nursing facility stay as well as the fact that she recently completed a course of Rocephin for UTI..  We will continue this for now and monitor clinical response/culture data.  Toxic/metabolic encephalopathy -Presents with altered mental status AAO x2 from baseline of AAOx4 which appears secondary to her above infection and below metabolic derangements. -For now, will treat infection and underlying medical disease with hopeful improvement as her metabolic abnormalities improve as well. -For disorientation use gentle behavioral techniques rather than pharmacotherapy to avoid onset of delirium. -Aspiration precautions and n.p.o. until more alert -Holding home Neurontin in the setting of altered mental status and AKI  AKI Hyperkalemia -Likely from hypovolemia in the setting of infection, at this time does not appear hypotensive/ATN related and Foley seems to be draining well so unlikely to be obstructive.  -Hyperkalemia but no peak T  waves on EKG -Obtain urine studies, administer gentle IVF (patient has heart failure with reduced ejection fraction)-holding home Lasix for now, resume as possible and close attention to patient's fluid status -Status post calcium gluconate and sodium bicarbonate in ED for hyperkalemia, will recheck and continue/monitor  Paroxysmal atrial fibrillation -Currently rate/rhythm controlled on Coreg, will continue for now -Continue Pradaxa for now, may need to be renally dose adjusted pending course of her AKI  Goals of care -Given recurrent hospitalization and overall clinical course has been diminishing, patient's daughter expresses that the patient would potentially be interested in pursuing hospice discussions. -Contact social work in a.m. for coordination of hospice discussion, consider possible palliative care consult  Heart failure with reduced ejection fraction CAD status post CABG -Currently holding home Lasix in the setting of AKI, resume as able and close attention to her volume status given gentle fluid resuscitation -Continue Crestor, Coreg as above -Currently holding home Imdur in the setting of acute infection and borderline blood pressures, resume as able  Gout -Continue allopurinol for now  COPD -Continue home Incruse Ellipta, Dulera  DVT prophylaxis: Pradaxa, SCD Code Status: DNR Family Communication: Spoke with daughter Georgeana Oertel phone number [phone] updated her on clinical status and confirmed patient's goals of care/wishes.  She additionally stated that given her overall clinical course, the patient at this time may be interested and pursuing hospice discussions.  She requested more information about this process. Disposition Plan: Likely discharge to skilled nursing facility versus home with home hospice in 2 to 5 days Consults called: None Admission status: Inpatient   S. Dellie Catholic, DO [facility] Pager [phone]  If 7PM-7AM, please contact  night-coverage [email or web address] Password [email or web address]  07/02/2019, 7:30 PM

## 2019-07-02 NOTE — ED Notes (Signed)
Pt transported to CT ?

## 2019-07-03 ENCOUNTER — Inpatient Hospital Stay (HOSPITAL_COMMUNITY): Payer: Medicare HMO

## 2019-07-03 DIAGNOSIS — N390 Urinary tract infection, site not specified: Secondary | ICD-10-CM

## 2019-07-03 DIAGNOSIS — R000 Tachycardia, unspecified: Secondary | ICD-10-CM

## 2019-07-03 DIAGNOSIS — E669 Obesity, unspecified: Secondary | ICD-10-CM | POA: Diagnosis present

## 2019-07-03 DIAGNOSIS — B952 Enterococcus as the cause of diseases classified elsewhere: Secondary | ICD-10-CM | POA: Diagnosis present

## 2019-07-03 DIAGNOSIS — R7881 Bacteremia: Secondary | ICD-10-CM

## 2019-07-03 DIAGNOSIS — Z960 Presence of urogenital implants: Secondary | ICD-10-CM

## 2019-07-03 DIAGNOSIS — N179 Acute kidney failure, unspecified: Secondary | ICD-10-CM

## 2019-07-03 DIAGNOSIS — I351 Nonrheumatic aortic (valve) insufficiency: Secondary | ICD-10-CM

## 2019-07-03 DIAGNOSIS — Z95810 Presence of automatic (implantable) cardiac defibrillator: Secondary | ICD-10-CM | POA: Diagnosis present

## 2019-07-03 DIAGNOSIS — D539 Nutritional anemia, unspecified: Secondary | ICD-10-CM | POA: Diagnosis present

## 2019-07-03 DIAGNOSIS — N3001 Acute cystitis with hematuria: Secondary | ICD-10-CM

## 2019-07-03 LAB — CBC WITH DIFFERENTIAL/PLATELET
Abs Immature Granulocytes: 0.05 10*3/uL (ref 0.00–0.07)
Basophils Absolute: 0 10*3/uL (ref 0.0–0.1)
Basophils Relative: 1 %
Eosinophils Absolute: 0.1 10*3/uL (ref 0.0–0.5)
Eosinophils Relative: 1 %
HCT: 37.1 % (ref 36.0–46.0)
Hemoglobin: 11.4 g/dL — ABNORMAL LOW (ref 12.0–15.0)
Immature Granulocytes: 1 %
Lymphocytes Relative: 26 %
Lymphs Abs: 1.8 10*3/uL (ref 0.7–4.0)
MCH: 33.2 pg (ref 26.0–34.0)
MCHC: 30.7 g/dL (ref 30.0–36.0)
MCV: 108.2 fL — ABNORMAL HIGH (ref 80.0–100.0)
Monocytes Absolute: 0.9 10*3/uL (ref 0.1–1.0)
Monocytes Relative: 13 %
Neutro Abs: 4.2 10*3/uL (ref 1.7–7.7)
Neutrophils Relative %: 58 %
Platelets: 144 10*3/uL — ABNORMAL LOW (ref 150–400)
RBC: 3.43 MIL/uL — ABNORMAL LOW (ref 3.87–5.11)
RDW: 15.2 % (ref 11.5–15.5)
WBC: 7 10*3/uL (ref 4.0–10.5)
nRBC: 0 % (ref 0.0–0.2)

## 2019-07-03 LAB — PROTEIN / CREATININE RATIO, URINE
Creatinine, Urine: 108.92 mg/dL
Protein Creatinine Ratio: 0.49 mg/mg{Cre} — ABNORMAL HIGH (ref 0.00–0.15)
Total Protein, Urine: 53 mg/dL

## 2019-07-03 LAB — BASIC METABOLIC PANEL
Anion gap: 9 (ref 5–15)
BUN: 25 mg/dL — ABNORMAL HIGH (ref 8–23)
CO2: 27 mmol/L (ref 22–32)
Calcium: 8.2 mg/dL — ABNORMAL LOW (ref 8.9–10.3)
Chloride: 106 mmol/L (ref 98–111)
Creatinine, Ser: 1.33 mg/dL — ABNORMAL HIGH (ref 0.44–1.00)
GFR calc Af Amer: 47 mL/min — ABNORMAL LOW (ref >60)
GFR calc non Af Amer: 41 mL/min — ABNORMAL LOW (ref >60)
Glucose, Bld: 85 mg/dL (ref 70–99)
Potassium: 4.6 mmol/L (ref 3.5–5.1)
Sodium: 142 mmol/L (ref 135–145)

## 2019-07-03 LAB — BLOOD CULTURE ID PANEL (REFLEXED)

## 2019-07-03 LAB — URINE CULTURE: Culture: >=100000 — AB

## 2019-07-03 LAB — COMPREHENSIVE METABOLIC PANEL
ALT: 24 U/L (ref 0–44)
AST: 37 U/L (ref 15–41)
Albumin: 2.6 g/dL — ABNORMAL LOW (ref 3.5–5.0)
Alkaline Phosphatase: 56 U/L (ref 38–126)
Anion gap: 11 (ref 5–15)
BUN: 28 mg/dL — ABNORMAL HIGH (ref 8–23)
CO2: 29 mmol/L (ref 22–32)
Calcium: 9 mg/dL (ref 8.9–10.3)
Chloride: 103 mmol/L (ref 98–111)
Creatinine, Ser: 1.41 mg/dL — ABNORMAL HIGH (ref 0.44–1.00)
GFR calc Af Amer: 44 mL/min — ABNORMAL LOW (ref >60)
GFR calc non Af Amer: 38 mL/min — ABNORMAL LOW (ref >60)
Glucose, Bld: 85 mg/dL (ref 70–99)
Potassium: 5.1 mmol/L (ref 3.5–5.1)
Sodium: 143 mmol/L (ref 135–145)
Total Bilirubin: 0.8 mg/dL (ref 0.3–1.2)
Total Protein: 5.5 g/dL — ABNORMAL LOW (ref 6.5–8.1)

## 2019-07-03 LAB — MAGNESIUM: Magnesium: 1.6 mg/dL — ABNORMAL LOW (ref 1.7–2.4)

## 2019-07-03 LAB — PHOSPHORUS: Phosphorus: 3.4 mg/dL (ref 2.5–4.6)

## 2019-07-03 LAB — CREATININE, URINE, RANDOM: Creatinine, Urine: 107.72 mg/dL

## 2019-07-03 LAB — LACTIC ACID, PLASMA: Lactic Acid, Venous: 1.3 mmol/L (ref 0.5–1.9)

## 2019-07-03 LAB — SARS CORONAVIRUS 2 (TAT 6-24 HRS): SARS Coronavirus 2: NEGATIVE

## 2019-07-03 LAB — ECHOCARDIOGRAM COMPLETE: Weight: 4091.74 oz

## 2019-07-03 LAB — SODIUM, URINE, RANDOM: Sodium, Ur: <10 mmol/L

## 2019-07-03 MED ORDER — SODIUM CHLORIDE 0.9 % IV SOLN: INTRAVENOUS | Status: DC

## 2019-07-03 MED ORDER — SODIUM CHLORIDE 0.9 % IV SOLN: INTRAVENOUS | Status: AC

## 2019-07-03 MED ORDER — PERFLUTREN LIPID MICROSPHERE
1.0000 mL | INTRAVENOUS | Status: AC | PRN
  Administered 2019-07-03: 2 mL via INTRAVENOUS
  Filled 2019-07-03: qty 10

## 2019-07-03 MED ORDER — MAGNESIUM SULFATE 2 GM/50ML IV SOLN
2.0000 g | Freq: Once | INTRAVENOUS | Status: AC
  Administered 2019-07-03: 2 g via INTRAVENOUS
  Filled 2019-07-03: qty 50

## 2019-07-03 MED ORDER — CHLORHEXIDINE GLUCONATE CLOTH 2 % EX PADS
6.0000 | MEDICATED_PAD | Freq: Every day | CUTANEOUS | Status: DC
  Administered 2019-07-03 – 2019-07-05 (×3): 6 via TOPICAL

## 2019-07-03 MED ORDER — SODIUM CHLORIDE 0.9 % IV SOLN
2.0000 g | INTRAVENOUS | Status: DC
  Administered 2019-07-03 (×2): 2 g via INTRAVENOUS
  Filled 2019-07-03 (×2): qty 2000
  Filled 2019-07-03: qty 2
  Filled 2019-07-03: qty 2000
  Filled 2019-07-03: qty 2

## 2019-07-03 NOTE — Consult Note (Signed)
Palliative Care Consult Note Reason: Goals of Care  69 yo woman with severe CAD s/p CABG, AICD, PM, COPD, morbid obesity who was at [facility] prior to admission. She was admitted with AMS and found to have Enterococcal UTI w/Bacteremia which has a high probability causing serious illness even with treatment given her implantable cardiac devices and poor cardiac output.   She has a husband who still lives in the home, he is also severely disabled and blind. He is unable to make decisions for her. Her daughter is her primary Management consultant. Her daughter says her mother would not want her life prolonged in her current condition. According to her daughter her mother has been declining and suffering for years. She has multiple decubitus ulcers, immobility, has chronic indwelling foley and sub acute/chronic hypoactive delirium from frequent recurring infections.  She has a MOST form indicating her preferences for care.I discussed the potential trajectories including transition to full comfort care with Hospice Facility Referral if her condition declines or she has SM needs or LTC w/ Hospice or ongoing medical intervention which may in fact prolong her life but not directly improve the QOL. In general her multiple chronic diseases are end stage and irreversible- comfort care at this point is a medically appropriate option.  Currently she is resting in bed, very weak, not able to have conversation but answers simple questions, her speech is difficult to understand. She denies pain or distress. She has a very severe intention tremor.  Recommendations:  1. DNR 2. Order placed to have AICD turned off ([employer]) 3. Comfort Measures Only- d/c all non-essential meds and those related directly to her comfort. 4. Palliative PRNs for comfort. 5. Referral to Hospice IPU - anticipate rapid decline following shift to comfort care.  Time: 70 min Greater than 50%  of this time was spent counseling and  coordinating care related to the above assessment and plan.

## 2019-07-03 NOTE — Progress Notes (Signed)
Scanned copy of [place] MOST

## 2019-07-03 NOTE — NC FL2 (Signed)
[place] MEDICAID FL2 LEVEL OF CARE SCREENING TOOL     IDENTIFICATION  Patient Name: Karen Stanley Birthdate: 11-11-1950 Sex: female Admission Date (Current Location): 07/02/2019  [facility] and [place] Number:  Producer, television/film/video and Address:  [facility],  [address] [place] [address]      Provider Number: [id number]  Attending Physician Name and Address:  Alba Cory, MD  Relative Name and Phone Number:       Current Level of Care: Hospital Recommended Level of Care: Skilled Nursing Facility Prior Approval Number:    Date Approved/Denied:   PASRR Number:    Discharge Plan: SNF    Current Diagnoses: Patient Active Problem List   Diagnosis Date Noted  . UTI (urinary tract infection) 07/02/2019  . Pressure injury of skin 05/23/2019  . SIRS (systemic inflammatory response syndrome) (HCC) 05/22/2019  . PAF (paroxysmal atrial fibrillation) (HCC) 05/22/2019  . Cellulitis 05/22/2019  . Cardiomyopathy (HCC) 30-35 % EF 11/20 05/22/2019  . HYPERCHOLESTEROLEMIA 02/26/2010  . TOBACCO ABUSE 02/26/2010  . Essential hypertension 02/26/2010  . Coronary atherosclerosis 02/26/2010  . EMPHYSEMA 02/26/2010  . COPD 02/26/2010  . GERD 02/26/2010    Orientation RESPIRATION BLADDER Height & Weight     Self, Place, Situation  O2(see dc summary) Incontinent Weight: 255 lb 11.7 oz (116 kg) Height:     BEHAVIORAL SYMPTOMS/MOOD NEUROLOGICAL BOWEL NUTRITION STATUS      Continent Diet(see dc summary)  AMBULATORY STATUS COMMUNICATION OF NEEDS Skin   Extensive Assist Verbally Normal                       Personal Care Assistance Level of Assistance  Bathing, Feeding, Dressing Bathing Assistance: Limited assistance Feeding assistance: Independent Dressing Assistance: Limited assistance     Functional Limitations Info  Sight, Speech, Hearing Sight Info: Adequate Hearing Info: Adequate Speech Info: Adequate    SPECIAL CARE FACTORS FREQUENCY  PT (By  licensed PT), OT (By licensed OT)     PT Frequency: 5 times weekly OT Frequency: 3 times weekly            Contractures Contractures Info: Not present    Additional Factors Info  Code Status, Allergies, Psychotropic Code Status Info: DNR Allergies Info: NKA Psychotropic Info: Effexor         Current Medications (07/03/2019):  This is the current hospital active medication list Current Facility-Administered Medications  Medication Dose Route Frequency Provider Last Rate Last Admin  . 0.9 %  sodium chloride infusion   Intravenous Continuous Regalado, Belkys A, MD 75 mL/hr at 07/03/19 1100 New Bag at 07/03/19 1100  . acetaminophen (TYLENOL) tablet 650 mg  650 mg Oral Q6H PRN Alda Ponder A, DO       Or  . acetaminophen (TYLENOL) suppository 650 mg  650 mg Rectal Q6H PRN Alda Ponder A, DO      . allopurinol (ZYLOPRIM) tablet 300 mg  300 mg Oral Daily Alda Ponder A, DO   300 mg at 07/03/19 0839  . ampicillin (OMNIPEN) 2 g in sodium chloride 0.9 % 100 mL IVPB  2 g Intravenous Q4H Regalado, Belkys A, MD      . bisacodyl (DULCOLAX) suppository 10 mg  10 mg Rectal Daily PRN Alda Ponder A, DO      . carvedilol (COREG) tablet 3.125 mg  3.125 mg Oral QHS Alda Ponder A, DO   Stopped at 07/03/19 0319  . Chlorhexidine Gluconate Cloth 2 % PADS 6 each  6 each Topical Daily Regalado, Belkys A, MD   6 each at 07/03/19 0839  . dabigatran (PRADAXA) capsule 150 mg  150 mg Oral BID Clinton Sawyer A, DO   150 mg at 07/03/19 0555  . mometasone-formoterol (DULERA) 100-5 MCG/ACT inhaler 2 puff  2 puff Inhalation BID Clinton Sawyer A, DO   2 puff at 07/03/19 1055  . ondansetron (ZOFRAN) tablet 4 mg  4 mg Oral Q6H PRN Clinton Sawyer A, DO       Or  . ondansetron (ZOFRAN) injection 4 mg  4 mg Intravenous Q6H PRN Clinton Sawyer A, DO      . pantoprazole (PROTONIX) EC tablet 40 mg  40 mg Oral Daily Clinton Sawyer A, DO   40 mg at 07/03/19 6160  . polyethylene glycol (MIRALAX / GLYCOLAX) packet 17  g  17 g Oral Daily PRN Clinton Sawyer A, DO      . rosuvastatin (CRESTOR) tablet 40 mg  40 mg Oral QPM Clinton Sawyer A, DO      . sodium chloride flush (NS) 0.9 % injection 3 mL  3 mL Intravenous Q12H Clinton Sawyer A, DO   3 mL at 07/03/19 0836  . umeclidinium bromide (INCRUSE ELLIPTA) 62.5 MCG/INH 1 puff  1 puff Inhalation Daily Clinton Sawyer A, DO   1 puff at 07/03/19 1055  . venlafaxine XR (EFFEXOR-XR) 24 hr capsule 150 mg  150 mg Oral Q breakfast Clinton Sawyer A, DO   150 mg at 07/03/19 7371     Discharge Medications: Please see discharge summary for a list of discharge medications.  Relevant Imaging Results:  Relevant Lab Results:   Additional Information    Shade Flood, LCSW

## 2019-07-03 NOTE — Progress Notes (Signed)
PROGRESS NOTE    Karen Stanley  SAY:[id number] DOB: [birth year] DOA: 07/02/2019 PCP: System, Pcp Not In   Brief Narrative: 69 year old with past medical history significant for heart failure with reduced ejection fraction 30 to 35% in November 2020, COPD, CAD status post CABG 2011, hypertension, paroxysmal A. fib on Pradaxa who presents from a skilled nursing facility with chief complaint of altered mental status and found to have a UTI. Patient was not able to provide history due to altered mental status.  Patient has indwelling Foley in place with decreased urine output over the last few days.  Of note patient recently admitted on 12/28 1216/09/11/2021 for cellulitis and was discharged to comes in with Foley catheter in place for incontinence.  There it seems like she has overall improved, except for on 1/28 when she was noted to have UTI and treated with Rocephin for 5 days completed treatment 2/3. The patient was febrile temperature 101, oriented to person and year otherwise has difficulty answering questions.  White blood cell 9.5, potassium 5.6, creatinine 1.6, BUN 30.  Baseline creatinine 0.7.  Urinalysis with yeast, squamous cell bacteria and leukocytes.  CT head without intracranial pathology.    Assessment & Plan:   Active Problems:   UTI (urinary tract infection)   1-UTI; in the setting of chronic Foley catheter Enterococcus bacteremia: Patient presents with fever, worsening confusion, UA with large leukocytes, white blood cell more than 50. Patient was a started on IV fluids and IV antibiotics. Antibiotics changed to ampicillin.  ID has been consulted  2-Toxic Metabolic Encephalopathy;  Related to infection.  Continue with IV antibiotics.  AKI, Hyperkalemia; Creatinine at 1.6.  After hydration creatinine has decreased to 1.4. Continue with IV fluids for a few more hours.  Chronic systolic Heart failure; ejection fraction 30 to 35%. Monitor on IV fluids.  Appears compensated.     Continue with carvedilol, and Pradaxa  Gout; continue with allopurinol.  COPD; continue with Incruse Ellipta  Hypomagnesemia; replete IV  Estimated body mass index is 47.92 kg/m as calculated from the following:   Height as of 05/23/19: 5\' 2"  (1.575 m).   Weight as of 05/29/19: 118.8 kg.   DVT prophylaxis: SCDs Code Status: DNR Family Communication: Daughter at bedside.  Asking for hospice referral and residential hospice facility.  We will proceed with palliative care consult for goals of care Disposition Plan:  Patient is from: Skilled nursing facility Anticipated d/c date: 2 to 3 days awaiting blood cultures results.  And further  goals of care Barriers to d/c or necessity for inpatient status: Patient requiring IV antibiotics, awaiting blood culture results, waiting goals of care  Consultants:   Palliative care  Procedures:   None  Antimicrobials:  Ampicillin  Subjective: Alert, she is asking for water   Objective: Vitals:   07/03/19 0200 07/03/19 0230 07/03/19 0318 07/03/19 0546  BP: (!) 111/55 108/61 (!) 99/59 (!) 152/136  Pulse: 84 85 84 88  Resp: 15 19 16 15   Temp:   (!) 97.3 F (36.3 C) 97.6 F (36.4 C)  TempSrc:   Oral   SpO2: 95% 95% 97% 96%    Intake/Output Summary (Last 24 hours) at 07/03/2019 0717 Last data filed at 07/03/2019 08/31/2019 Gross per 24 hour  Intake 1510 ml  Output --  Net 1510 ml   There were no vitals filed for this visit.  Examination:  General exam: Appears calm and comfortable  Respiratory system: Clear to auscultation. Respiratory effort normal. Cardiovascular system:  S1 & S2 heard, RRR. No JVD, murmurs, rubs, gallops or clicks. No pedal edema. Gastrointestinal system: Abdomen is nondistended, soft and nontender. Central nervous system: Alert. Extremities: Symmetric 5 x 5 power. Skin: No rashes, lesions or ulcers   Data Reviewed: I have personally reviewed following labs and imaging studies  CBC: Recent Labs  Lab  07/02/19 1658 07/03/19 0340  WBC 9.5 7.0  NEUTROABS 6.6 4.2  HGB 12.1 11.4*  HCT 40.2 37.1  MCV 107.5* 108.2*  PLT 190 144*   Basic Metabolic Panel: Recent Labs  Lab 07/02/19 1658 07/02/19 2321 07/03/19 0340  NA 140 142 143  K 5.6* 4.6 5.1  CL 99 106 103  CO2 30 27 29   GLUCOSE 92 85 85  BUN 30* 25* 28*  CREATININE 1.60* 1.33* 1.41*  CALCIUM 9.5 8.2* 9.0  MG  --  1.6*  --   PHOS  --  3.4  --    GFR: CrCl cannot be calculated (Unknown ideal weight.). Liver Function Tests: Recent Labs  Lab 07/02/19 1658 07/03/19 0340  AST 44* 37  ALT 27 24  ALKPHOS 64 56  BILITOT 0.6 0.8  PROT 6.6 5.5*  ALBUMIN 3.1* 2.6*   No results for input(s): LIPASE, AMYLASE in the last 168 hours. No results for input(s): AMMONIA in the last 168 hours. Coagulation Profile: No results for input(s): INR, PROTIME in the last 168 hours. Cardiac Enzymes: No results for input(s): CKTOTAL, CKMB, CKMBINDEX, TROPONINI in the last 168 hours. BNP (last 3 results) No results for input(s): PROBNP in the last 8760 hours. HbA1C: No results for input(s): HGBA1C in the last 72 hours. CBG: No results for input(s): GLUCAP in the last 168 hours. Lipid Profile: No results for input(s): CHOL, HDL, LDLCALC, TRIG, CHOLHDL, LDLDIRECT in the last 72 hours. Thyroid Function Tests: No results for input(s): TSH, T4TOTAL, FREET4, T3FREE, THYROIDAB in the last 72 hours. Anemia Panel: No results for input(s): VITAMINB12, FOLATE, FERRITIN, TIBC, IRON, RETICCTPCT in the last 72 hours. Sepsis Labs: Recent Labs  Lab 07/02/19 1658 07/02/19 1841  LATICACIDVEN 1.7 1.3    Recent Results (from the past 240 hour(s))  Blood culture (routine x 2)     Status: None (Preliminary result)   Collection Time: 07/02/19  4:58 PM   Specimen: BLOOD  Result Value Ref Range Status   Specimen Description   Final    BLOOD RIGHT ANTECUBITAL Performed at [facility], [address], [place], [place]     Special Requests   Final    BOTTLES DRAWN AEROBIC AND ANAEROBIC Blood Culture results may not be optimal due to an excessive volume of blood received in culture bottles Performed at [facility], [address], [place], [place]    Culture  Setup Time   Final    GRAM POSITIVE COCCI IN PAIRS IN CHAINS IN BOTH AEROBIC AND ANAEROBIC BOTTLES CRITICAL RESULT CALLED TO, READ BACK BY AND VERIFIED WITH: T. GREEN PHARMD, AT 0703 07/03/19 BY 08/31/19 Performed at [facility], [address], [place], [place]    Culture GRAM POSITIVE COCCI  Final   Report Status PENDING  Incomplete  SARS CORONAVIRUS 2 (TAT 6-24 HRS) Nasopharyngeal Nasopharyngeal Swab     Status: None   Collection Time: 07/02/19  8:04 PM   Specimen: Nasopharyngeal Swab  Result Value Ref Range Status   SARS Coronavirus 2 NEGATIVE NEGATIVE Final    Comment: (NOTE) SARS-CoV-2 target nucleic acids are  NOT DETECTED. The SARS-CoV-2 RNA is generally detectable in upper and lower respiratory specimens during the acute phase of infection. Negative results do not preclude SARS-CoV-2 infection, do not rule out co-infections with other pathogens, and should not be used as the sole basis for treatment or other patient management decisions. Negative results must be combined with clinical observations, patient history, and epidemiological information. The expected result is Negative. Fact Sheet for Patients: SugarRoll.be Fact Sheet for Healthcare Providers: https://www.woods-mathews.com/ This test is not yet approved or cleared by the [place] FDA and  has been authorized for detection and/or diagnosis of SARS-CoV-2 by FDA under an Emergency Use Authorization (EUA). This EUA will remain  in effect (meaning this test can be used) for the duration of the COVID-19 declaration under Section 56 4(b)(1) of the Act, 21 U.S.C. section 360bbb-3(b)(1), unless  the authorization is terminated or revoked sooner. Performed at [facility], [place] [address] [place], [place] [address]          Radiology Studies: CT Head Wo Contrast  Result Date: 07/02/2019 CLINICAL DATA:  Altered mental status. EXAM: CT HEAD WITHOUT CONTRAST TECHNIQUE: Contiguous axial images were obtained from the base of the skull through the vertex without intravenous contrast. COMPARISON:  None. FINDINGS: Brain: There is mild cerebral atrophy with widening of the extra-axial spaces and ventricular dilatation. There are areas of decreased attenuation within the white matter tracts of the supratentorial brain, consistent with microvascular disease changes. Vascular: No hyperdense vessel or unexpected calcification. Skull: Normal. Negative for fracture or focal lesion. Sinuses/Orbits: No acute finding. Other: None. IMPRESSION: No acute intracranial pathology. Electronically Signed   By: Virgina Norfolk M.D.   On: 07/02/2019 19:02   DG Chest Port 1 View  Result Date: 07/02/2019 CLINICAL DATA:  Shortness of breath. EXAM: PORTABLE CHEST 1 VIEW COMPARISON:  May 22, 2019 FINDINGS: There is a dual lead AICD. Multiple sternal wires and vascular clips are seen. There is no evidence of acute infiltrate, pleural effusion or pneumothorax. The cardiac silhouette is mildly enlarged and unchanged in size. The visualized skeletal structures are unremarkable. IMPRESSION: 1. Evidence of prior median sternotomy/CABG. 2. Stable cardiomegaly. 3. No acute or active cardiopulmonary disease. Electronically Signed   By: Virgina Norfolk M.D.   On: 07/02/2019 17:42        Scheduled Meds: . allopurinol  300 mg Oral Daily  . carvedilol  3.125 mg Oral QHS  . Chlorhexidine Gluconate Cloth  6 each Topical Daily  . dabigatran  150 mg Oral BID  . mometasone-formoterol  2 puff Inhalation BID  . pantoprazole  40 mg Oral Daily  . rosuvastatin  40 mg Oral QPM  . sodium chloride flush  3 mL Intravenous  Q12H  . umeclidinium bromide  1 puff Inhalation Daily  . venlafaxine XR  150 mg Oral Q breakfast   Continuous Infusions: . ampicillin-sulbactam (UNASYN) IV Stopped (07/03/19 0252)  . lactated ringers Stopped (07/03/19 0252)     LOS: 1 day    Time spent: 35 minutes.     Elmarie Shiley, MD [facility]   If 7PM-7AM, please contact night-coverage [email or web address]  07/03/2019, 7:17 AM

## 2019-07-03 NOTE — Progress Notes (Signed)
PHARMACY - PHYSICIAN COMMUNICATION CRITICAL VALUE ALERT - BLOOD CULTURE IDENTIFICATION (BCID)  Karen Stanley is an 69 y.o. female who presented to [facility] on 07/02/2019   Assessment:   68 y.o. female who presented to [facility] on 07/02/2019 with a chief complaint of AMS changes, UTI  Name of physician (or Provider) Contacted: Dr Carmell Austria  Current antibiotics: ampicillin/sulbactam  Changes to prescribed antibiotics recommended:  Recommendations accepted by provider - change to ampicillin and this organism will generate auto-ID consult  Results for orders placed or performed during the hospital encounter of 07/02/19  Blood Culture ID Panel (Reflexed) (Collected: 07/02/2019  5:59 PM)  Result Value Ref Range   Enterococcus species DETECTED (A) NOT DETECTED   Vancomycin resistance NOT DETECTED NOT DETECTED   Listeria monocytogenes NOT DETECTED NOT DETECTED   Staphylococcus species NOT DETECTED NOT DETECTED   Staphylococcus aureus (BCID) NOT DETECTED NOT DETECTED   Streptococcus species NOT DETECTED NOT DETECTED   Streptococcus agalactiae NOT DETECTED NOT DETECTED   Streptococcus pneumoniae NOT DETECTED NOT DETECTED   Streptococcus pyogenes NOT DETECTED NOT DETECTED   Acinetobacter baumannii NOT DETECTED NOT DETECTED   Enterobacteriaceae species NOT DETECTED NOT DETECTED   Enterobacter cloacae complex NOT DETECTED NOT DETECTED   Escherichia coli NOT DETECTED NOT DETECTED   Klebsiella oxytoca NOT DETECTED NOT DETECTED   Klebsiella pneumoniae NOT DETECTED NOT DETECTED   Proteus species NOT DETECTED NOT DETECTED   Serratia marcescens NOT DETECTED NOT DETECTED   Haemophilus influenzae NOT DETECTED NOT DETECTED   Neisseria meningitidis NOT DETECTED NOT DETECTED   Pseudomonas aeruginosa NOT DETECTED NOT DETECTED   Candida albicans NOT DETECTED NOT DETECTED   Candida glabrata NOT DETECTED NOT DETECTED   Candida krusei NOT DETECTED NOT DETECTED   Candida parapsilosis NOT DETECTED NOT DETECTED    Candida tropicalis NOT DETECTED NOT DETECTED    Juliette Alcide, PharmD, BCPS.   Work Cell: [phone] 07/03/2019 11:17 AM

## 2019-07-03 NOTE — TOC Initial Note (Signed)
Transition of Care [facility]) - Initial/Assessment Note    Patient Details  Name: Karen Stanley MRN: [id number] Date of Birth: 1950/12/10  [facility]) CM/SW Contact:    Shade Flood, LCSW Phone Number: 07/03/2019, 11:53 AM  Clinical Narrative:                  Pt admitted from [facility] rehab. Pt was hospitalized at [facility] in December of 2020 and she discharged to [facility] for rehab last month. Pt admits with UTI and confusion. Received TOC consult to discuss possible hospice referral at dc.   Spoke with MD to discuss pt's status. Attending MD indicates that she has requested a Palliative Care consult to establish goals of care. Unclear if pt meets hospice care criteria at this time. Pt has been at [facility] under her Medicare benefits. Pt's daughter informs this LCSW that she has applied for Medicaid for pt.  Pt had [facility] involved during her previous hospitalization but the case with them is now closed per [place] at [facility].  Spoke with pt's daughter to update on Palliative Care consult. TOC will follow up after recommendations are in place. Pt does not have Medicaid in place yet so if decision is for hospice care at a nursing home, would have to see if [facility] would accept pt back with her Medicaid pending.  Assigned TOC will follow.   Expected Discharge Plan: Skilled Nursing Facility Barriers to Discharge: Continued Medical Work up   Patient Goals and CMS Choice        Expected Discharge Plan and Services Expected Discharge Plan: [place] In-house Referral: Clinical Social Work     Living arrangements for the past 2 months: [place], [place]                                      Prior Living Arrangements/Services Living arrangements for the past 2 months: [place], [place] Lives with:: Facility Resident, Self Patient language and need for interpreter reviewed:: Yes        Need  for Family Participation in Patient Care: No (Comment) Care giver support system in place?: Yes (comment)   Criminal Activity/Legal Involvement Pertinent to Current Situation/Hospitalization: No - Comment as needed  Activities of Daily Living   ADL Screening (condition at time of admission) Is the patient deaf or have difficulty hearing?: No Does the patient have difficulty seeing, even when wearing glasses/contacts?: No Does the patient have difficulty concentrating, remembering, or making decisions?: Yes Patient able to express need for assistance with ADLs?: Yes Does the patient have difficulty dressing or bathing?: Yes Independently performs ADLs?: No Communication: Independent Dressing (OT): Needs assistance Is this a change from baseline?: Pre-admission baseline Grooming: Needs assistance Is this a change from baseline?: Pre-admission baseline Feeding: Needs assistance Is this a change from baseline?: Pre-admission baseline Bathing: Needs assistance Is this a change from baseline?: Pre-admission baseline Toileting: Needs assistance Is this a change from baseline?: Pre-admission baseline In/Out Bed: Dependent, Needs assistance Is this a change from baseline?: Pre-admission baseline Walks in Home: Dependent Is this a change from baseline?: Pre-admission baseline Weakness of Legs: Both Weakness of Arms/Hands: None  Permission Sought/Granted                  Emotional Assessment     Affect (typically observed): Pleasant Orientation: : Oriented to Self Alcohol /  Substance Use: Not Applicable Psych Involvement: No (comment)  Admission diagnosis:  UTI (urinary tract infection) [N39.0] SOB (shortness of breath) [R06.02] Acute cystitis with hematuria [N30.01] AKI (acute kidney injury) (HCC) [N17.9] Altered mental status, unspecified altered mental status type [R41.82] Patient Active Problem List   Diagnosis Date Noted  . UTI (urinary tract infection) 07/02/2019  .  Pressure injury of skin 05/23/2019  . SIRS (systemic inflammatory response syndrome) (HCC) 05/22/2019  . PAF (paroxysmal atrial fibrillation) (HCC) 05/22/2019  . Cellulitis 05/22/2019  . Cardiomyopathy (HCC) 30-35 % EF 11/20 05/22/2019  . HYPERCHOLESTEROLEMIA 02/26/2010  . TOBACCO ABUSE 02/26/2010  . Essential hypertension 02/26/2010  . Coronary atherosclerosis 02/26/2010  . EMPHYSEMA 02/26/2010  . COPD 02/26/2010  . GERD 02/26/2010   PCP:  System, Pcp Not In Pharmacy:   [facility] [place], [place] - [address] [place] [address] Phone: [phone] Fax: [phone]     Social Determinants of Health (SDOH) Interventions    Readmission Risk Interventions Readmission Risk Prevention Plan 07/03/2019  Transportation Screening Complete  Medication Review (RN CM) Complete  Some recent data might be hidden

## 2019-07-03 NOTE — Progress Notes (Signed)
  Echocardiogram 2D Echocardiogram with definity has been performed.  Leta Jungling M 07/03/2019, 3:11 PM

## 2019-07-03 NOTE — Consult Note (Signed)
[facility]    Date of Admission:  07/02/2019           Day 2 ampicillin       Reason for Consult: Automatic consultation for Enterococcal bacteremia     Assessment: She has enterococcal UTI complicated by bacteremia.  I agree with continuing ampicillin therapy.  Repeat blood cultures and TTE are pending.  She is at relatively high risk for endocarditis involving native valves and/or her AICD leads.  Elective care consult has been requested to discuss goals of care.  Plan: 1. Continue ampicillin for now 2. Results of repeat blood cultures and TTE 3. If possible, avoid placing a midline or PICC until repeat blood cultures are negative at 48 hours  Principal Problem:   Enterococcal bacteremia Active Problems:   UTI (urinary tract infection)   HYPERCHOLESTEROLEMIA   Essential hypertension   Coronary atherosclerosis   COPD (chronic obstructive pulmonary disease) (HCC)   PAF (paroxysmal atrial fibrillation) (HCC)   Cardiomyopathy (HCC) 30-35 % EF 11/20   Macrocytic anemia   Acute kidney injury (HCC)   AICD (automatic cardioverter/defibrillator) present   Obesity   Scheduled Meds: . allopurinol  300 mg Oral Daily  . carvedilol  3.125 mg Oral QHS  . Chlorhexidine Gluconate Cloth  6 each Topical Daily  . dabigatran  150 mg Oral BID  . mometasone-formoterol  2 puff Inhalation BID  . pantoprazole  40 mg Oral Daily  . rosuvastatin  40 mg Oral QPM  . sodium chloride flush  3 mL Intravenous Q12H  . umeclidinium bromide  1 puff Inhalation Daily  . venlafaxine XR  150 mg Oral Q breakfast   Continuous Infusions: . sodium chloride 50 mL/hr at 07/03/19 1334  . ampicillin (OMNIPEN) IV 2 g (07/03/19 1405)   PRN Meds:.acetaminophen **OR** acetaminophen, bisacodyl, ondansetron **OR** ondansetron (ZOFRAN) IV, perflutren lipid microspheres (DEFINITY) IV suspension, polyethylene glycol  HPI: Bonnye Halle is a 69 y.o. female with multiple chronic medical  problems who was hospitalized in late December with left thigh cellulitis.  A Foley catheter was placed because of mobility problems and she was discharged to a skilled nursing facility with Foley catheter in place on 05/29/2019.  She is diagnosed with a urinary tract infection on 06/22/2019.  She was treated with empiric ceftriaxone for 5 days although the urine culture grew Enterococcus faecalis.  She was admitted yesterday with a decline in mental status and decreased urine output.  Her temperature was 101.6 degrees and her urinalysis showed greater than 50 white blood cells and red blood cells.  Both admission blood cultures are growing Enterococcus.   Review of Systems: Review of Systems  Unable to perform ROS: Mental acuity    Past Medical History:  Diagnosis Date  . CHF (congestive heart failure) (HCC)   . Chronic kidney disease   . COPD (chronic obstructive pulmonary disease) (HCC)   . Coronary artery disease   . Hypertension     Social History   Tobacco Use  . Smoking status: Never Smoker  . Smokeless tobacco: Never Used  Substance Use Topics  . Alcohol use: Never  . Drug use: Never    Family History  Problem Relation Age of Onset  . Heart disease Mother   . Hypertension Father   . Cancer Sister    No Known Allergies  OBJECTIVE: Blood pressure (!) 113/46, pulse 93, temperature 97.9 F (36.6 C), temperature source Oral, resp. rate 14, weight  116 kg, SpO2 94 %.  Physical Exam Constitutional:      Comments: She is resting quietly in bed.  She does not appear to be in any distress.  She frequently nods off to sleep and is unable to answer questions consistently.  HENT:     Mouth/Throat:     Pharynx: No oropharyngeal exudate.     Comments: She is edentulous. Eyes:     Conjunctiva/sclera: Conjunctivae normal.  Cardiovascular:     Rate and Rhythm: Normal rate and regular rhythm.     Heart sounds: No murmur.  Pulmonary:     Effort: Pulmonary effort is normal.      Breath sounds: Normal breath sounds.  Chest:    Abdominal:     General: There is no distension.     Palpations: Abdomen is soft. There is no mass.     Comments: Mild suprapubic tenderness.  Musculoskeletal:        General: No swelling or tenderness.     Cervical back: Neck supple.  Skin:    Findings: No rash.     Lab Results Lab Results  Component Value Date   WBC 7.0 07/03/2019   HGB 11.4 (L) 07/03/2019   HCT 37.1 07/03/2019   MCV 108.2 (H) 07/03/2019   PLT 144 (L) 07/03/2019    Lab Results  Component Value Date   CREATININE 1.41 (H) 07/03/2019   BUN 28 (H) 07/03/2019   NA 143 07/03/2019   K 5.1 07/03/2019   CL 103 07/03/2019   CO2 29 07/03/2019    Lab Results  Component Value Date   ALT 24 07/03/2019   AST 37 07/03/2019   ALKPHOS 56 07/03/2019   BILITOT 0.8 07/03/2019     Microbiology: Recent Results (from the past 240 hour(s))  Blood culture (routine x 2)     Status: None (Preliminary result)   Collection Time: 07/02/19  4:58 PM   Specimen: BLOOD  Result Value Ref Range Status   Specimen Description   Final    BLOOD RIGHT ANTECUBITAL Performed at [facility], [address], [place], [place] [address]    Special Requests   Final    BOTTLES DRAWN AEROBIC AND ANAEROBIC Blood Culture results may not be optimal due to an excessive volume of blood received in culture bottles Performed at [facility], [address], [place], [place] [address]    Culture  Setup Time   Final    GRAM POSITIVE COCCI IN PAIRS IN CHAINS IN BOTH AEROBIC AND ANAEROBIC BOTTLES CRITICAL RESULT CALLED TO, READ BACK BY AND VERIFIED WITH: T. GREEN PHARMD, AT 0703 07/03/19 BY Renato Shin Performed at [facility], [address], [place], [place] [address]    Culture GRAM POSITIVE COCCI  Final   Report Status PENDING  Incomplete  Blood culture (routine x 2)     Status: None (Preliminary result)   Collection Time: 07/02/19  5:59 PM    Specimen: BLOOD  Result Value Ref Range Status   Specimen Description   Final    BLOOD LEFT ANTECUBITAL Performed at [facility], [address] [place], [place] [address]    Special Requests   Final    BOTTLES DRAWN AEROBIC AND ANAEROBIC Blood Culture results may not be optimal due to an excessive volume of blood received in culture bottles Performed at [facility], [address] [place], [place] [address]    Culture  Setup Time  Final    GRAM POSITIVE COCCI IN PAIRS AND CHAINS IN BOTH AEROBIC AND ANAEROBIC BOTTLES Organism ID to follow CRITICAL VALUE NOTED.  VALUE IS CONSISTENT WITH PREVIOUSLY REPORTED AND CALLED VALUE.    Culture   Final    CULTURE REINCUBATED FOR BETTER GROWTH Performed at [facility], [address], [place], [place] [address]    Report Status PENDING  Incomplete  Blood Culture ID Panel (Reflexed)     Status: Abnormal   Collection Time: 07/02/19  5:59 PM  Result Value Ref Range Status   Enterococcus species DETECTED (A) NOT DETECTED Final    Comment: RESULT CALLED TO, READ BACK BY AND VERIFIED WITH: Maximino Greenland PharmD 10:55 07/03/19 ([email or web address])    Vancomycin resistance NOT DETECTED NOT DETECTED Final   Listeria monocytogenes NOT DETECTED NOT DETECTED Final   Staphylococcus species NOT DETECTED NOT DETECTED Final   Staphylococcus aureus (BCID) NOT DETECTED NOT DETECTED Final   Streptococcus species NOT DETECTED NOT DETECTED Final   Streptococcus agalactiae NOT DETECTED NOT DETECTED Final   Streptococcus pneumoniae NOT DETECTED NOT DETECTED Final   Streptococcus pyogenes NOT DETECTED NOT DETECTED Final   Acinetobacter baumannii NOT DETECTED NOT DETECTED Final   Enterobacteriaceae species NOT DETECTED NOT DETECTED Final   Enterobacter cloacae complex NOT DETECTED NOT DETECTED Final   Escherichia coli NOT DETECTED NOT DETECTED Final   Klebsiella oxytoca NOT DETECTED NOT DETECTED Final   Klebsiella pneumoniae NOT  DETECTED NOT DETECTED Final   Proteus species NOT DETECTED NOT DETECTED Final   Serratia marcescens NOT DETECTED NOT DETECTED Final   Haemophilus influenzae NOT DETECTED NOT DETECTED Final   Neisseria meningitidis NOT DETECTED NOT DETECTED Final   Pseudomonas aeruginosa NOT DETECTED NOT DETECTED Final   Candida albicans NOT DETECTED NOT DETECTED Final   Candida glabrata NOT DETECTED NOT DETECTED Final   Candida krusei NOT DETECTED NOT DETECTED Final   Candida parapsilosis NOT DETECTED NOT DETECTED Final   Candida tropicalis NOT DETECTED NOT DETECTED Final    Comment: Performed at [facility], [address] [place], [place] [address]  SARS CORONAVIRUS 2 (TAT 6-24 HRS) Nasopharyngeal Nasopharyngeal Swab     Status: None   Collection Time: 07/02/19  8:04 PM   Specimen: Nasopharyngeal Swab  Result Value Ref Range Status   SARS Coronavirus 2 NEGATIVE NEGATIVE Final    Comment: (NOTE) SARS-CoV-2 target nucleic acids are NOT DETECTED. The SARS-CoV-2 RNA is generally detectable in upper and lower respiratory specimens during the acute phase of infection. Negative results do not preclude SARS-CoV-2 infection, do not rule out co-infections with other pathogens, and should not be used as the sole basis for treatment or other patient management decisions. Negative results must be combined with clinical observations, patient history, and epidemiological information. The expected result is Negative. Fact Sheet for Patients: HairSlick.no Fact Sheet for Healthcare Providers: quierodirigir.com This test is not yet approved or cleared by the [place] FDA and  has been authorized for detection and/or diagnosis of SARS-CoV-2 by FDA under an Emergency Use Authorization (EUA). This EUA will remain  in effect (meaning this test can be used) for the duration of the COVID-19 declaration under Section 56 4(b)(1) of the Act, 21  U.S.C. section 360bbb-3(b)(1), unless the authorization is terminated or revoked sooner. Performed at [facility], [address] [place], [place] [address]     Cliffton Asters, MD [facility] [phone] pager  [phone] cell 07/03/2019, 3:41 PM

## 2019-07-03 NOTE — Evaluation (Addendum)
Clinical/Bedside Swallow Evaluation Patient Details  Name: Karen Stanley MRN: [id number] Date of Birth: Sep 01, 1950  Today's Date: 07/03/2019 Time: SLP Start Time (ACUTE ONLY): 75 SLP Stop Time (ACUTE ONLY): 1300 SLP Time Calculation (min) (ACUTE ONLY): 40 min  Past Medical History:  Past Medical History:  Diagnosis Date  . CHF (congestive heart failure) ([place])   . Chronic kidney disease   . COPD (chronic obstructive pulmonary disease) ([place])   . Coronary artery disease   . Hypertension    Past Surgical History:  Past Surgical History:  Procedure Laterality Date  . CARDIAC DEFIBRILLATOR PLACEMENT    . CORONARY ARTERY BYPASS GRAFT  2011   HPI:  69 yo female adm to [facility] with AMS, fever, has indwelling foley, emphysema, COPD, Afib, cellulitis, GERD, CHF and UTI.  Pt PSH + for CABG.  She has been at [facility] since early January 2020 and had AMS, difficulty breathing with admission.  Swallow eval ordered.   Assessment / Plan / Recommendation Clinical Impression  Patient presents with mild oral dysphagia *? due to lack of dentures and mild lingual weakness/discoordination resulting in oral retention of solid bolus that she did not clear independently.  Verbal cues to consume liquids were helpful to clear mild residuals. Pt passed 3 ounce Yale water test today easily - delayed throat clearing noted.    She has h/o reflux disease for which she takes a PPI.  Given lack of dentition and mild speech/articulation changes, recommend dys3/thin diet. SLP will follow up x1 to assure tolerance.  Provided verbal and written compensation strategies for pt and daughter using teach back.   Patient and daughter educated to findings/recommendations. Asked for OT consult given pt's level of tremoring - Thanks. SLP Visit Diagnosis: Dysphagia, oral phase (R13.11)    Aspiration Risk  Mild aspiration risk    Diet Recommendation Dysphagia 3 (Mech soft);Thin liquid   Liquid Administration via: Straw Medication  Administration: Other (Comment)(as tolerated) Supervision: Patient able to self feed;Intermittent supervision to cue for compensatory strategies Compensations: Minimize environmental distractions;Slow rate;Small sips/bites Postural Changes: Seated upright at 90 degrees;Remain upright for at least 30 minutes after po intake    Other  Recommendations Oral Care Recommendations: Oral care BID   Follow up Recommendations None      Frequency and Duration min 1 x/week  1 week       Prognosis Prognosis for Safe Diet Advancement: Fair      Swallow Study   General Date of Onset: 07/03/19 HPI: 69 yo female adm to [facility] with AMS, fever, has indwelling foley, emphysema, COPD, Afib, cellulitis, GERD, CHF and UTI.  Pt PSH + for CABG.  She has been at [facility] since early January 2020 and has not been eating well the last few days prior to admit.  Swallow eval ordered. Type of Study: Bedside Swallow Evaluation Diet Prior to this Study: NPO Temperature Spikes Noted: No Respiratory Status: Room air History of Recent Intubation: No Behavior/Cognition: Alert;Cooperative;Pleasant mood Oral Cavity Assessment: Within Functional Limits Oral Care Completed by SLP: No Oral Cavity - Dentition: Edentulous(pt has dentures at home, did not take them to [facility] with h er) Vision: Functional for self-feeding Self-Feeding Abilities: (difficulty due to level of bilateral tremor, dtr and pt report this is worse than prior to [facility] stay) Patient Positioning: Upright in bed Baseline Vocal Quality: Normal Volitional Cough: Strong Volitional Swallow: Able to elicit    Oral/Motor/Sensory Function Overall Oral Motor/Sensory Function: Generalized oral weakness   Ice Chips Ice chips: Within functional  limits Presentation: Spoon   Thin Liquid Thin Liquid: Within functional limits Presentation: Straw;Spoon;Self Fed Other Comments: self fed with lidded cup    Nectar Thick Nectar Thick Liquid: Not tested   Honey Thick  Honey Thick Liquid: Not tested   Puree Puree: Within functional limits Presentation: Self Fed;Spoon   Solid     Solid: Impaired Presentation: Self Fed Oral Phase Impairments: Reduced lingual movement/coordination Oral Phase Functional Implications: Other (comment);Oral residue Other Comments: oral residuals noted, pt needed verbal cues to clear      Karen Stanley 07/03/2019,1:52 PM   Karen Infante, MS [facility] SLP Acute Rehab Services Office [phone]

## 2019-07-03 NOTE — Progress Notes (Signed)
PHARMACY - PHYSICIAN COMMUNICATION CRITICAL VALUE ALERT - BLOOD CULTURE IDENTIFICATION (BCID)  Karen Stanley is an 69 y.o. female who presented to [facility] on 07/02/2019 with a chief complaint of AMS changes, UTI  Assessment:  8 yoF, SNF resident, chronic foley cath after prev admit 12/28 - 1/4 for cellulitis. Treated for UTI at [facility] 1/28 x 5 days with IM Rocephin.  Previous Urine Cx 12/28 with Enterococcus faecalis: sens to Ampicillin, intermediate to Vancomycin. Blood cx resulted with GPC in 2/2 bottles from same set drawn 2/7 Urine Cx 2/7 also sent  Name of physician (or Provider) Contacted: Dr Sunnie Nielsen  Current antibiotics: Unasyn 3gm q6  Changes to prescribed antibiotics recommended: none  Otho Bellows PharmD 07/03/2019  7:09 AM

## 2019-07-04 NOTE — Progress Notes (Signed)
Called [employer] regarding order to turn AICD to OFF. [employer] rep stated the she will send a message to technician to give nurse a call. Patient's pertinent info was provide as well as call back number.

## 2019-07-04 NOTE — Progress Notes (Addendum)
PROGRESS NOTE    Karen Stanley  KGU:[id number] DOB: 1950-09-27 DOA: 07/02/2019 PCP: System, Pcp Not In   Brief Narrative: 69 year old withpast medical history significant for heart failure with reduced ejection fraction 30 to 35% in November 2020, COPD, CAD status post CABG 2011, hypertension, paroxysmal A. fib on Pradaxa who presents from a skilled nursing facility with chief complaint of altered mental status and found to have a UTI.Patient was not able to provide history due to altered mental status. Patient has indwelling Foley in place with decreased urine output over the last few days. Of note patient recently admitted on 12/28 1216/09/11/2021 for cellulitis and was discharged to comes in with Foley catheter in place for incontinence. There it seems like she has overall improved, except for on 1/28 when she was noted to have UTI and treated with Rocephin for 5 days completed treatment 2/3. The patient was febrile temperature 101, oriented to person and year otherwise has difficulty answering questions. White blood cell 9.5, potassium 5.6, creatinine 1.6, BUN 30. Baseline creatinine 0.7. Urinalysis with yeast, squamous cell bacteria and leukocytes. CT head without intracranial pathology.  Patient was found to have UTI in the setting of chronic Foley catheter with Enterococcus bacteremia, toxic metabolic encephalopathy-hypoactive delirium due to frequent UTI. AKI/hyperkalemia and chronic systolic CHF.Patient was seen by infectious disease-treated with IV Unasyn-with plan for repeat blood culture and TTE.  If it is felt that there is relatively high risk for endocarditis involving native valves and/or her AICD leads.  Patient family desired to discuss with palliative care and palliative care consulted.  Patient has a MOST form indicating her preference for care.Based on palliative care discussion, DNR, AICD to be turned off, comfort measures only and discontinued all nonessential's meds/IV antibiotics  and referred for hospice IPU. Plan is for discharge to inpatient hospice.    Subjective: Seen this am, Not in pain. Resting. Alert,awake, not in distress. No new complaints agrees for hospice today Awaiting for AICD be turned off.  Assessment & Plan: Enterococcal bacteremia UTI in the setting of chronic Foley catheter Toxic metabolic encephalopathy/hypoactive delirium Systolic CHF/cardiomyopathy 30-35 % EF 11/20/Coronary atherosclerosis HLD Essential hypertension COPD  PAF  Macrocytic anemia Acute kidney injury Morbid obesity w/ BMI 46. Debility/physical deconditioning AICD in place to be turned off prior to discharge  Pressure Ulcer: Previously end of December had stage II rt buttock pressure ulcer POA-Consult wound care to asses  Body mass index is 46.77 kg/m.    DVT prophylaxis: SCD Code Status: DNR  Family Communication: plan of care discussed with patient at bedside. Disposition Plan:Anticipated d/c to: HOSPICE Barriers to d/c or conditions that needs to be met prior to d/c: Awaiting for placement to hospice.discussed w CM.  Consultants: Palliative care, infectious disease.   Procedures:see note Microbiology:see note Antimicrobials: Anti-infectives (From admission, onward)   Start     Dose/Rate Route Frequency Ordered Stop   07/03/19 1200  ampicillin (OMNIPEN) 2 g in sodium chloride 0.9 % 100 mL IVPB  Status:  Discontinued     2 g 300 mL/hr over 20 Minutes Intravenous Every 4 hours 07/03/19 1114 07/03/19 1832   07/03/19 0200  Ampicillin-Sulbactam (UNASYN) 3 g in sodium chloride 0.9 % 100 mL IVPB  Status:  Discontinued     3 g 200 mL/hr over 30 Minutes Intravenous Every 6 hours 07/02/19 2051 07/03/19 1114   07/02/19 1815  Ampicillin-Sulbactam (UNASYN) 3 g in sodium chloride 0.9 % 100 mL IVPB     3 g  200 mL/hr over 30 Minutes Intravenous  Once 07/02/19 1815 07/02/19 2013      Medications: Scheduled Meds: . allopurinol  300 mg Oral Daily  . carvedilol   3.125 mg Oral QHS  . Chlorhexidine Gluconate Cloth  6 each Topical Daily  . mometasone-formoterol  2 puff Inhalation BID  . pantoprazole  40 mg Oral Daily  . sodium chloride flush  3 mL Intravenous Q12H   Continuous Infusions:  Objective: Vitals:   07/03/19 2026 07/04/19 0535 07/04/19 0918 07/04/19 1413  BP: 113/90 (!) 142/88  (!) 116/56  Pulse: 95 91  98  Resp: '18 18  20  ' Temp: 98.2 F (36.8 C) 97.7 F (36.5 C)  97.8 F (36.6 C)  TempSrc: Oral Oral  Oral  SpO2: 92% 93% 94% 95%  Weight:        Intake/Output Summary (Last 24 hours) at 07/04/2019 1618 Last data filed at 07/04/2019 1000 Gross per 24 hour  Intake 511.67 ml  Output 200 ml  Net 311.67 ml   Filed Weights   07/03/19 1150  Weight: 116 kg   Weight change:   Body mass index is 46.77 kg/m.  Intake/Output from previous day: 02/08 0701 - 02/09 0700 In: 781.7 [P.O.:60; I.V.:471.7; IV Piggyback:250] Out: 200 [Urine:200] Intake/Output this shift: Total I/O In: 240 [P.O.:240] Out: -   Examination:  General exam: AAO, ill/frail, not in distress HEENT:Oral mucosa moist, Ear/Nose WNL grossly, dentition normal. Respiratory system: Clear bilaterally,no wheezing or crackles,no use of accessory muscle Cardiovascular system: S1 & S2 +, No JVD,. Gastrointestinal system: Abdomen soft, NT,ND, BS+ Nervous System:Alert, awake, moving extremities and grossly nonfocal Extremities: No edema, distal peripheral pulses palpable.  Skin: No rashes,no icterus. MSK: Normal muscle bulk,tone, power  Data Reviewed: I have personally reviewed following labs and imaging studies  CBC: Recent Labs  Lab 07/02/19 1658 07/03/19 0340  WBC 9.5 7.0  NEUTROABS 6.6 4.2  HGB 12.1 11.4*  HCT 40.2 37.1  MCV 107.5* 108.2*  PLT 190 790*   Basic Metabolic Panel: Recent Labs  Lab 07/02/19 1658 07/02/19 2321 07/03/19 0340  NA 140 142 143  K 5.6* 4.6 5.1  CL 99 106 103  CO2 '30 27 29  ' GLUCOSE 92 85 85  BUN 30* 25* 28*  CREATININE  1.60* 1.33* 1.41*  CALCIUM 9.5 8.2* 9.0  MG  --  1.6*  --   PHOS  --  3.4  --    GFR: Estimated Creatinine Clearance: 45.5 mL/min (A) (by C-G formula based on SCr of 1.41 mg/dL (H)). Liver Function Tests: Recent Labs  Lab 07/02/19 1658 07/03/19 0340  AST 44* 37  ALT 27 24  ALKPHOS 64 56  BILITOT 0.6 0.8  PROT 6.6 5.5*  ALBUMIN 3.1* 2.6*   No results for input(s): LIPASE, AMYLASE in the last 168 hours. No results for input(s): AMMONIA in the last 168 hours. Coagulation Profile: No results for input(s): INR, PROTIME in the last 168 hours. Cardiac Enzymes: No results for input(s): CKTOTAL, CKMB, CKMBINDEX, TROPONINI in the last 168 hours. BNP (last 3 results) No results for input(s): PROBNP in the last 8760 hours. HbA1C: No results for input(s): HGBA1C in the last 72 hours. CBG: No results for input(s): GLUCAP in the last 168 hours. Lipid Profile: No results for input(s): CHOL, HDL, LDLCALC, TRIG, CHOLHDL, LDLDIRECT in the last 72 hours. Thyroid Function Tests: No results for input(s): TSH, T4TOTAL, FREET4, T3FREE, THYROIDAB in the last 72 hours. Anemia Panel: No results for input(s): VITAMINB12, FOLATE,  FERRITIN, TIBC, IRON, RETICCTPCT in the last 72 hours. Sepsis Labs: Recent Labs  Lab 07/02/19 1658 07/02/19 1841  LATICACIDVEN 1.7 1.3    Recent Results (from the past 240 hour(s))  Blood culture (routine x 2)     Status: Abnormal (Preliminary result)   Collection Time: 07/02/19  4:58 PM   Specimen: BLOOD  Result Value Ref Range Status   Specimen Description   Final    BLOOD RIGHT ANTECUBITAL Performed at [facility] [address], [place], [place] [address]    Special Requests   Final    BOTTLES DRAWN AEROBIC AND ANAEROBIC Blood Culture results may not be optimal due to an excessive volume of blood received in culture bottles Performed at [facility] [address] [place], [place] [address]    Culture  Setup Time    Final    GRAM POSITIVE COCCI IN PAIRS IN CHAINS IN BOTH AEROBIC AND ANAEROBIC BOTTLES CRITICAL RESULT CALLED TO, READ BACK BY AND VERIFIED WITH: T. GREEN PHARMD, AT 0703 07/03/19 BY Rush Landmark Performed at [facility], [place] [address] [place], [place] [address]    Culture ENTEROCOCCUS FAECALIS (A)  Final   Report Status PENDING  Incomplete  Urine culture     Status: Abnormal   Collection Time: 07/02/19  5:18 PM   Specimen: Urine, Random  Result Value Ref Range Status   Specimen Description   Final    URINE, RANDOM Performed at [facility] [address] [place], [place] [address]    Special Requests   Final    NONE Performed at [facility], [place] [address], [place], [place] [address]    Culture >=100,000 COLONIES/mL YEAST (A)  Final   Report Status 07/03/2019 FINAL  Final  Blood culture (routine x 2)     Status: Abnormal (Preliminary result)   Collection Time: 07/02/19  5:59 PM   Specimen: BLOOD  Result Value Ref Range Status   Specimen Description   Final    BLOOD LEFT ANTECUBITAL Performed at [facility] [address], [place], [place] [address]    Special Requests   Final    BOTTLES DRAWN AEROBIC AND ANAEROBIC Blood Culture results may not be optimal due to an excessive volume of blood received in culture bottles Performed at [facility] [address] [place], [place] [address]    Culture  Setup Time   Final    GRAM POSITIVE COCCI IN PAIRS AND CHAINS IN BOTH AEROBIC AND ANAEROBIC BOTTLES CRITICAL VALUE NOTED.  VALUE IS CONSISTENT WITH PREVIOUSLY REPORTED AND CALLED VALUE. Performed at [facility], [place] [address] [place], [place] [address]    Culture ENTEROCOCCUS FAECALIS (A)  Final   Report Status PENDING  Incomplete  Blood Culture ID Panel (Reflexed)     Status: Abnormal   Collection Time: 07/02/19  5:59 PM  Result Value Ref Range Status   Enterococcus species DETECTED  (A) NOT DETECTED Final    Comment: RESULT CALLED TO, READ BACK BY AND VERIFIED WITH: Karel Jarvis PharmD 10:55 07/03/19 ([email or web address])    Vancomycin resistance NOT DETECTED NOT DETECTED Final   Listeria monocytogenes NOT DETECTED NOT DETECTED Final   Staphylococcus species NOT DETECTED NOT DETECTED Final   Staphylococcus aureus (BCID) NOT DETECTED NOT DETECTED Final   Streptococcus species NOT DETECTED NOT DETECTED Final   Streptococcus agalactiae NOT DETECTED NOT DETECTED Final   Streptococcus pneumoniae NOT DETECTED NOT  DETECTED Final   Streptococcus pyogenes NOT DETECTED NOT DETECTED Final   Acinetobacter baumannii NOT DETECTED NOT DETECTED Final   Enterobacteriaceae species NOT DETECTED NOT DETECTED Final   Enterobacter cloacae complex NOT DETECTED NOT DETECTED Final   Escherichia coli NOT DETECTED NOT DETECTED Final   Klebsiella oxytoca NOT DETECTED NOT DETECTED Final   Klebsiella pneumoniae NOT DETECTED NOT DETECTED Final   Proteus species NOT DETECTED NOT DETECTED Final   Serratia marcescens NOT DETECTED NOT DETECTED Final   Haemophilus influenzae NOT DETECTED NOT DETECTED Final   Neisseria meningitidis NOT DETECTED NOT DETECTED Final   Pseudomonas aeruginosa NOT DETECTED NOT DETECTED Final   Candida albicans NOT DETECTED NOT DETECTED Final   Candida glabrata NOT DETECTED NOT DETECTED Final   Candida krusei NOT DETECTED NOT DETECTED Final   Candida parapsilosis NOT DETECTED NOT DETECTED Final   Candida tropicalis NOT DETECTED NOT DETECTED Final    Comment: Performed at [facility], [place] [address], [place], [place] [address]  SARS CORONAVIRUS 2 (TAT 6-24 HRS) Nasopharyngeal Nasopharyngeal Swab     Status: None   Collection Time: 07/02/19  8:04 PM   Specimen: Nasopharyngeal Swab  Result Value Ref Range Status   SARS Coronavirus 2 NEGATIVE NEGATIVE Final    Comment: (NOTE) SARS-CoV-2 target nucleic acids are NOT DETECTED. The SARS-CoV-2 RNA is generally detectable in upper and  lower respiratory specimens during the acute phase of infection. Negative results do not preclude SARS-CoV-2 infection, do not rule out co-infections with other pathogens, and should not be used as the sole basis for treatment or other patient management decisions. Negative results must be combined with clinical observations, patient history, and epidemiological information. The expected result is Negative. Fact Sheet for Patients: SugarRoll.be Fact Sheet for Healthcare Providers: https://www.woods-mathews.com/ This test is not yet approved or cleared by the [place] FDA and  has been authorized for detection and/or diagnosis of SARS-CoV-2 by FDA under an Emergency Use Authorization (EUA). This EUA will remain  in effect (meaning this test can be used) for the duration of the COVID-19 declaration under Section 56 4(b)(1) of the Act, 21 U.S.C. section 360bbb-3(b)(1), unless the authorization is terminated or revoked sooner. Performed at [facility], [place] [address] [place], [place] [address]   Culture, blood (routine x 2)     Status: None (Preliminary result)   Collection Time: 07/03/19  1:33 PM   Specimen: BLOOD  Result Value Ref Range Status   Specimen Description   Final    BLOOD RIGHT THUMB Performed at [facility] [address], [place], [place] [address]    Special Requests   Final    BOTTLES DRAWN AEROBIC ONLY Blood Culture results may not be optimal due to an inadequate volume of blood received in culture bottles Performed at [facility] [address], [place], [place] [address]    Culture   Final    NO GROWTH < 24 HOURS Performed at [facility] [address] [place], [place] [address]    Report Status PENDING  Incomplete  Culture, blood (routine x 2)     Status: None (Preliminary result)   Collection Time: 07/03/19  1:33 PM   Specimen: BLOOD RIGHT HAND  Result  Value Ref Range Status   Specimen Description   Final    BLOOD RIGHT HAND Performed at [facility] [address], [place], [place] [address]    Special Requests  Final    BOTTLES DRAWN AEROBIC ONLY Blood Culture results may not be optimal due to an inadequate volume of blood received in culture bottles Performed at [facility], [place] [address], [place], [place] [address]    Culture   Final    NO GROWTH < 24 HOURS Performed at [facility] [address] [place], [place] [address]    Report Status PENDING  Incomplete      Radiology Studies: CT Head Wo Contrast  Result Date: 07/02/2019 CLINICAL DATA:  Altered mental status. EXAM: CT HEAD WITHOUT CONTRAST TECHNIQUE: Contiguous axial images were obtained from the base of the skull through the vertex without intravenous contrast. COMPARISON:  None. FINDINGS: Brain: There is mild cerebral atrophy with widening of the extra-axial spaces and ventricular dilatation. There are areas of decreased attenuation within the white matter tracts of the supratentorial brain, consistent with microvascular disease changes. Vascular: No hyperdense vessel or unexpected calcification. Skull: Normal. Negative for fracture or focal lesion. Sinuses/Orbits: No acute finding. Other: None. IMPRESSION: No acute intracranial pathology. Electronically Signed   By: Virgina Norfolk M.D.   On: 07/02/2019 19:02   DG Chest Port 1 View  Result Date: 07/02/2019 CLINICAL DATA:  Shortness of breath. EXAM: PORTABLE CHEST 1 VIEW COMPARISON:  May 22, 2019 FINDINGS: There is a dual lead AICD. Multiple sternal wires and vascular clips are seen. There is no evidence of acute infiltrate, pleural effusion or pneumothorax. The cardiac silhouette is mildly enlarged and unchanged in size. The visualized skeletal structures are unremarkable. IMPRESSION: 1. Evidence of prior median sternotomy/CABG. 2. Stable cardiomegaly. 3. No acute or  active cardiopulmonary disease. Electronically Signed   By: Virgina Norfolk M.D.   On: 07/02/2019 17:42   ECHOCARDIOGRAM COMPLETE  Result Date: 07/03/2019    ECHOCARDIOGRAM REPORT   Patient Name:   WALDINE ZENZ Date of Exam: 07/03/2019 Medical Rec #:  [id number]    Height:       62.0 in Accession #:    [id number]   Weight:       255.7 lb Date of Birth:  1950-10-10     BSA:          2.12 m Patient Age:    72 years     BP:           113/46 mmHg Patient Gender: F            HR:           93 bpm. Exam Location:  Inpatient Procedure: 2D Echo and Intracardiac Opacification Agent Indications:     Tachycardia [[id number]]  History:         Patient has prior history of Echocardiogram examinations, most                  recent 03/26/2019. COPD; Risk Factors:Former Smoker. Heart                  failure with reduced EF 30-35%. Altered mental status. UTI.  Sonographer:     Darlina Sicilian RDCS Referring Phys:  [place] Diagnosing Phys: Loralie Champagne MD IMPRESSIONS  1. Left ventricular ejection fraction, by estimation, is 25 to 30%. The left ventricle has severely decreased function. The left ventrical demonstrates regional wall motion abnormalities (see scoring diagram/findings for description). The anteroseptal and inferoseptal walls are akinetic, akinetic mid to apical anterior wall, akinetic apical inferior wall, akinetic apex. This is suggestive of large LAD-territory infarction. Left ventricular diastolic parameters are  indeterminate (not measured). No LV thrombus noted.  2. Right ventricular systolic function is normal. There is normal pulmonary artery systolic pressure. The estimated right ventricular systolic pressure is 37.8 mmHg.  3. The aortic valve is tricuspid. Aortic valve regurgitation is mild. No aortic stenosis.  4. Trivial mitral regurgitation.  5. The IVC appears normal in size. FINDINGS  Left Ventricle: Left ventricular ejection fraction, by estimation, is 25 to 30%. The left ventricle has severely  decreased function. The left ventricle demonstrates regional wall motion abnormalities. Definity contrast agent was given IV to delineate the left ventricular endocardial borders. The left ventricular internal cavity size was normal in size. There is no left ventricular hypertrophy. Right Ventricle: The right ventricular size is normal. No increase in right ventricular wall thickness. Right ventricular systolic function is normal. There is normal pulmonary artery systolic pressure. The tricuspid regurgitant velocity is 2.46 m/s, and  with an assumed right atrial pressure of 3 mmHg, the estimated right ventricular systolic pressure is 58.8 mmHg. Left Atrium: Left atrial size was normal in size. Right Atrium: Right atrial size was normal in size. Pericardium: Trivial pericardial effusion is present. Mitral Valve: The mitral valve is normal in structure and function. There is mild calcification of the mitral valve leaflet(s). Mild mitral annular calcification. Trivial mitral valve regurgitation. No evidence of mitral valve stenosis. Tricuspid Valve: The tricuspid valve is normal in structure. Tricuspid valve regurgitation is trivial. Aortic Valve: The aortic valve is tricuspid. Aortic valve regurgitation is mild. Mild aortic valve sclerosis is present, with no evidence of aortic valve stenosis. Pulmonic Valve: The pulmonic valve was normal in structure. Pulmonic valve regurgitation is trivial. Aorta: The aortic root is normal in size and structure. Venous: The inferior vena cava is normal in size with greater than 50% respiratory variability, suggesting right atrial pressure of 3 mmHg. IAS/Shunts: No atrial level shunt detected by color flow Doppler.  LEFT VENTRICLE PLAX 2D LVIDd:         5.20 cm LVIDs:         4.00 cm LV PW:         0.80 cm LV IVS:        1.10 cm LVOT diam:     2.10 cm LV SV Index:   25.60 LVOT Area:     3.46 cm  LV Volumes (MOD) LV area d, A2C:    31.80 cm LV area d, A4C:    35.70 cm LV area s,  A2C:    27.70 cm LV area s, A4C:    28.90 cm LV major d, A2C:   8.07 cm LV major d, A4C:   8.11 cm LV major s, A2C:   8.25 cm LV major s, A4C:   7.67 cm LV vol d, MOD A2C: 104.0 ml LV vol d, MOD A4C: 128.0 ml LV vol s, MOD A2C: 79.8 ml LV vol s, MOD A4C: 87.7 ml LV SV MOD A2C:     24.2 ml LV SV MOD A4C:     128.0 ml LV SV MOD BP:      29.4 ml LEFT ATRIUM         Index LA diam:    3.70 cm 1.74 cm/m   AORTA Ao Root diam: 3.30 cm Ao Asc diam:  3.30 cm TRICUSPID VALVE TR Peak grad:   24.2 mmHg TR Vmax:        246.00 cm/s  SHUNTS Systemic Diam: 2.10 cm Loralie Champagne MD Electronically signed by Loralie Champagne MD Signature  Date/Time: 07/03/2019/6:30:10 PM    Final (Updated)      LOS: 2 days   Time spent: More than 50% of that time was spent in counseling and/or coordination of care.  Antonieta Pert, MD [facility]  07/04/2019, 4:19 PM

## 2019-07-04 NOTE — Discharge Summary (Signed)
Physician Discharge Summary  Karen Stanley OMV:[id number] DOB: [birth year] DOA: 07/02/2019  PCP: System, Pcp Not In  Admit date: 07/02/2019 Discharge date: 07/06/2019  Admitted From: ALF Disposition: long-term SNF with [facility]  Recommendations for Outpatient Follow-up:  1. hospice care at [facility]  Home Health:no Equipment/Devices: none  Discharge Condition: Stable Code Status: DNR Diet recommendation:REGULAR  Brief/Interim Summary: 69 year old with past medical history significant for heart failure with reduced ejection fraction 30 to 35% in November 2020, COPD, CAD status post CABG 2011, hypertension, paroxysmal A. fib on Pradaxa who presents from a skilled nursing facility with chief complaint of altered mental status and found to have a UTI.Patient was not able to provide history due to altered mental status.  Patient has indwelling Foley in place with decreased urine output over the last few days.  Of note patient recently admitted on 12/28 1216/09/11/2021 for cellulitis and was discharged to comes in with Foley catheter in place for incontinence.  There it seems like she has overall improved, except for on 1/28 when she was noted to have UTI and treated with Rocephin for 5 days completed treatment 2/3. The patient was febrile temperature 101, oriented to person and year otherwise has difficulty answering questions.  White blood cell 9.5, potassium 5.6, creatinine 1.6, BUN 30.  Baseline creatinine 0.7.  Urinalysis with yeast, squamous cell bacteria and leukocytes.  CT head without intracranial pathology.  Patient was found to have UTI in the setting of chronic Foley catheter with Enterococcus bacteremia, toxic metabolic encephalopathy-hypoactive delirium due to frequent UTI. AKI/hyperkalemia and chronic systolic CHF.Patient was seen by infectious disease-treated with IV Unasyn-with plan for repeat blood culture and TTE.  If it is felt that there is relatively high risk for endocarditis involving native  valves and/or her AICD leads.  Patient family desired to discuss with palliative care and palliative care consulted.  Patient has a MOST form indicating her preference for care. Based on palliative care discussion, DNR, AICD to be turned off, comfort measures only and discontinued all nonessential's meds/IV antibiotics and referred for hospice IPU. Inpatient hospice has not accepted the patient at this time and looking into nursing home placement with hospice care.    Discharge Diagnoses:  Assessment & Plan: Enterococcal bacteremia-seen by ID treated with IV antibiotics repeat blood culture was negative.  Antibiotic stopped after she was transitioned to comfort measures on 2/8 UTI in the setting of chronic Foley catheter Toxic metabolic encephalopathy/hypoactive delirium Systolic CHF/cardiomyopathy 30-35 % EF 11/20/Coronary atherosclerosis HLD Essential hypertension COPD  PAF  Macrocytic anemia Acute kidney injury Morbid obesity w/ BMI 46. Debility/physical deconditioning AICD in place - turned off during this hospitalization Unstageable pressure ulcer on bilateral buttock present on admission: Wound care and dressing to continue with  "dressing procedure/placement/frequency: Cleanse sacral and buttocks wounds with NS and pat dry.  Apply silvadene cream to wound bed.  Cover with ABD pads and tape. Change daily.  Turn and reposition every two hours.  Mattress with low air loss feature." Multiple comorbidities/debility/deconditioning  Plan: after palliative care meeting family opted for comfort measures. IV Antibiotics were discontinued 07/03/19. Patient is currently DNR and on comfort measures and on minimal po meds to help with comfort.discussed with case manager who is trying to arrange nursing home placement and discharge with hospice care.    DVT prophylaxis: SCD Code Status: DNR  Family Communication: plan of care discussed with patient at bedside.  I had called and updated the patient's  daughter 70/10 and is aware  patient is on comfort measures not on antibiotics and that case manager is looking for places for further disposition- NH w hospise. Disposition Plan:Anticipated d/c to: HOSPICE Barriers to d/c or conditions that needs to be met prior to d/c: discharge to long term SNF with hospice today   Consultants: Palliative care, infectious disease.   Procedures:see note Microbiology:see note  Bilatera Pressure wound on buttock- unstageable, POA, pressure injuries, now including bony prominences of ischium.  Consults:  Id  Palliative care  Subjective: Pleasant.  Alert awake.  Not in acute distress.  She is poping to go to nursing home Discharge Exam: Vitals:   07/06/19 0903 07/06/19 0906  BP:    Pulse:    Resp:    Temp:    SpO2: 96% 96%   General: Pt is alert, awake obese not in acute distress.  On room air.  Cardiovascular: RRR, S1/S2 +, no rubs, no gallops Respiratory: CTA bilaterally, no wheezing, no rhonchi Abdominal: Soft, NT, ND, bowel sounds + Extremities: no edema, no cyanosis  Discharge Instructions   Allergies as of 07/06/2019   No Known Allergies     Medication List    STOP taking these medications   Biofreeze 4 % Gel Generic drug: Menthol (Topical Analgesic)   cefTRIAXone 1 g injection Commonly known as: ROCEPHIN   dabigatran 150 MG Caps capsule Commonly known as: PRADAXA   feeding supplement (PRO-STAT SUGAR FREE 64) Liqd   gabapentin 600 MG tablet Commonly known as: NEURONTIN   Gerhardt's butt cream Crea   isosorbide mononitrate 30 MG 24 hr tablet Commonly known as: IMDUR   multivitamin with minerals Tabs tablet   nystatin cream Commonly known as: MYCOSTATIN   ondansetron 4 MG tablet Commonly known as: ZOFRAN   potassium chloride SA 20 MEQ tablet Commonly known as: KLOR-CON   PROBIOTIC PO   rosuvastatin 40 MG tablet Commonly known as: CRESTOR     TAKE these medications   allopurinol 300 MG tablet Commonly  known as: ZYLOPRIM Take 300 mg by mouth daily.   carvedilol 3.125 MG tablet Commonly known as: COREG Take 3.125 mg by mouth at bedtime.   furosemide 20 MG tablet Commonly known as: LASIX Take 20 mg by mouth daily.   mometasone-formoterol 100-5 MCG/ACT Aero Commonly known as: DULERA Inhale 2 puffs into the lungs 2 (two) times daily.   pantoprazole 40 MG tablet Commonly known as: PROTONIX Take 40 mg by mouth daily.   polyethylene glycol 17 g packet Commonly known as: MIRALAX / GLYCOLAX Take 17 g by mouth daily as needed for mild constipation.   silver sulfADIAZINE 1 % cream Commonly known as: SILVADENE Apply topically daily.   umeclidinium bromide 62.5 MCG/INH Aepb Commonly known as: INCRUSE ELLIPTA Inhale 1 puff into the lungs daily.   venlafaxine XR 150 MG 24 hr capsule Commonly known as: EFFEXOR-XR Take 150 mg by mouth daily with breakfast.       No Known Allergies  The results of significant diagnostics from this hospitalization (including imaging, microbiology, ancillary and laboratory) are listed below for reference.    Microbiology: Recent Results (from the past 240 hour(s))  Blood culture (routine x 2)     Status: Abnormal   Collection Time: 07/02/19  4:58 PM   Specimen: BLOOD  Result Value Ref Range Status   Specimen Description   Final    BLOOD RIGHT ANTECUBITAL Performed at [facility] [address] [place], [place] [address]    Special Requests   Final  BOTTLES DRAWN AEROBIC AND ANAEROBIC Blood Culture results may not be optimal due to an excessive volume of blood received in culture bottles Performed at [facility], [place] [address], [place], [place] [address]    Culture  Setup Time   Final    GRAM POSITIVE COCCI IN PAIRS IN CHAINS IN BOTH AEROBIC AND ANAEROBIC BOTTLES CRITICAL RESULT CALLED TO, READ BACK BY AND VERIFIED WITH: T. GREEN PHARMD, AT 0703 07/03/19 BY Rush Landmark Performed at [facility], [place] [address] [place], [place] [address]    Culture ENTEROCOCCUS FAECALIS (A)  Final   Report Status 07/05/2019 FINAL  Final   Organism ID, Bacteria ENTEROCOCCUS FAECALIS  Final      Susceptibility   Enterococcus faecalis - MIC*    AMPICILLIN <=2 SENSITIVE Sensitive     VANCOMYCIN 1 SENSITIVE Sensitive     GENTAMICIN SYNERGY SENSITIVE Sensitive     * ENTEROCOCCUS FAECALIS  Urine culture     Status: Abnormal   Collection Time: 07/02/19  5:18 PM   Specimen: Urine, Random  Result Value Ref Range Status   Specimen Description   Final    URINE, RANDOM Performed at [facility] [address] [place], [place] [address]    Special Requests   Final    NONE Performed at [facility], [place] [address], [place], [place] [address]    Culture >=100,000 COLONIES/mL YEAST (A)  Final   Report Status 07/03/2019 FINAL  Final  Blood culture (routine x 2)     Status: Abnormal   Collection Time: 07/02/19  5:59 PM   Specimen: BLOOD  Result Value Ref Range Status   Specimen Description   Final    BLOOD LEFT ANTECUBITAL Performed at [facility] [address], [place], [place] [address]    Special Requests   Final    BOTTLES DRAWN AEROBIC AND ANAEROBIC Blood Culture results may not be optimal due to an excessive volume of blood received in culture bottles Performed at [facility] [address] [place], [place] [address]    Culture  Setup Time   Final    GRAM POSITIVE COCCI IN PAIRS AND CHAINS IN BOTH AEROBIC AND ANAEROBIC BOTTLES CRITICAL VALUE NOTED.  VALUE IS CONSISTENT WITH PREVIOUSLY REPORTED AND CALLED VALUE.    Culture (A)  Final    ENTEROCOCCUS FAECALIS SUSCEPTIBILITIES PERFORMED ON PREVIOUS CULTURE WITHIN THE LAST 5 DAYS. Performed at [facility], [place] [address] [place],  [address]    Report Status 07/05/2019 FINAL  Final  Blood Culture ID Panel (Reflexed)     Status: Abnormal    Collection Time: 07/02/19  5:59 PM  Result Value Ref Range Status   Enterococcus species DETECTED (A) NOT DETECTED Final    Comment: RESULT CALLED TO, READ BACK BY AND VERIFIED WITH: Karel Jarvis PharmD 10:55 07/03/19 ([email or web address])    Vancomycin resistance NOT DETECTED NOT DETECTED Final   Listeria monocytogenes NOT DETECTED NOT DETECTED Final   Staphylococcus species NOT DETECTED NOT DETECTED Final   Staphylococcus aureus (BCID) NOT DETECTED NOT DETECTED Final   Streptococcus species NOT DETECTED NOT DETECTED Final   Streptococcus agalactiae NOT DETECTED NOT DETECTED Final   Streptococcus pneumoniae NOT DETECTED NOT DETECTED Final   Streptococcus pyogenes NOT DETECTED NOT DETECTED Final   Acinetobacter baumannii NOT DETECTED NOT DETECTED Final   Enterobacteriaceae species NOT DETECTED NOT DETECTED Final   Enterobacter cloacae complex NOT DETECTED NOT  DETECTED Final   Escherichia coli NOT DETECTED NOT DETECTED Final   Klebsiella oxytoca NOT DETECTED NOT DETECTED Final   Klebsiella pneumoniae NOT DETECTED NOT DETECTED Final   Proteus species NOT DETECTED NOT DETECTED Final   Serratia marcescens NOT DETECTED NOT DETECTED Final   Haemophilus influenzae NOT DETECTED NOT DETECTED Final   Neisseria meningitidis NOT DETECTED NOT DETECTED Final   Pseudomonas aeruginosa NOT DETECTED NOT DETECTED Final   Candida albicans NOT DETECTED NOT DETECTED Final   Candida glabrata NOT DETECTED NOT DETECTED Final   Candida krusei NOT DETECTED NOT DETECTED Final   Candida parapsilosis NOT DETECTED NOT DETECTED Final   Candida tropicalis NOT DETECTED NOT DETECTED Final    Comment: Performed at [facility], [place] [address] [place], [place] [address]  SARS CORONAVIRUS 2 (TAT 6-24 HRS) Nasopharyngeal Nasopharyngeal Swab     Status: None   Collection Time: 07/02/19  8:04 PM   Specimen: Nasopharyngeal Swab  Result Value Ref Range Status   SARS Coronavirus 2 NEGATIVE NEGATIVE Final    Comment: (NOTE) SARS-CoV-2  target nucleic acids are NOT DETECTED. The SARS-CoV-2 RNA is generally detectable in upper and lower respiratory specimens during the acute phase of infection. Negative results do not preclude SARS-CoV-2 infection, do not rule out co-infections with other pathogens, and should not be used as the sole basis for treatment or other patient management decisions. Negative results must be combined with clinical observations, patient history, and epidemiological information. The expected result is Negative. Fact Sheet for Patients: SugarRoll.be Fact Sheet for Healthcare Providers: https://www.woods-mathews.com/ This test is not yet approved or cleared by the [place] FDA and  has been authorized for detection and/or diagnosis of SARS-CoV-2 by FDA under an Emergency Use Authorization (EUA). This EUA will remain  in effect (meaning this test can be used) for the duration of the COVID-19 declaration under Section 56 4(b)(1) of the Act, 21 U.S.C. section 360bbb-3(b)(1), unless the authorization is terminated or revoked sooner. Performed at [facility], [place] [address] [place], [place] [address]   Culture, blood (routine x 2)     Status: None (Preliminary result)   Collection Time: 07/03/19  1:33 PM   Specimen: BLOOD  Result Value Ref Range Status   Specimen Description   Final    BLOOD RIGHT THUMB Performed at [facility] [address], [place], [place] [address]    Special Requests   Final    BOTTLES DRAWN AEROBIC ONLY Blood Culture results may not be optimal due to an inadequate volume of blood received in culture bottles Performed at [facility] [address], [place], [place] [address]    Culture   Final    NO GROWTH 2 DAYS Performed at [facility] [address] [place], [place] [address]    Report Status PENDING  Incomplete  Culture, blood (routine x 2)     Status: None  (Preliminary result)   Collection Time: 07/03/19  1:33 PM   Specimen: BLOOD RIGHT HAND  Result Value Ref Range Status   Specimen Description   Final    BLOOD RIGHT HAND Performed at [facility] [address], [place], [place] [address]    Special Requests   Final    BOTTLES DRAWN AEROBIC ONLY Blood Culture results may not be optimal due to an inadequate volume of blood received in culture bottles Performed at [facility] [address] [place], [place]  [address]    Culture   Final    NO GROWTH 2 DAYS Performed at [facility], [place] [address], [place], [place] [address]    Report Status PENDING  Incomplete    Procedures/Studies: CT Head Wo Contrast  Result Date: 07/02/2019 CLINICAL DATA:  Altered mental status. EXAM: CT HEAD WITHOUT CONTRAST TECHNIQUE: Contiguous axial images were obtained from the base of the skull through the vertex without intravenous contrast. COMPARISON:  None. FINDINGS: Brain: There is mild cerebral atrophy with widening of the extra-axial spaces and ventricular dilatation. There are areas of decreased attenuation within the white matter tracts of the supratentorial brain, consistent with microvascular disease changes. Vascular: No hyperdense vessel or unexpected calcification. Skull: Normal. Negative for fracture or focal lesion. Sinuses/Orbits: No acute finding. Other: None. IMPRESSION: No acute intracranial pathology. Electronically Signed   By: Virgina Norfolk M.D.   On: 07/02/2019 19:02   DG Chest Port 1 View  Result Date: 07/02/2019 CLINICAL DATA:  Shortness of breath. EXAM: PORTABLE CHEST 1 VIEW COMPARISON:  May 22, 2019 FINDINGS: There is a dual lead AICD. Multiple sternal wires and vascular clips are seen. There is no evidence of acute infiltrate, pleural effusion or pneumothorax. The cardiac silhouette is mildly enlarged and unchanged in size. The visualized skeletal structures are unremarkable.  IMPRESSION: 1. Evidence of prior median sternotomy/CABG. 2. Stable cardiomegaly. 3. No acute or active cardiopulmonary disease. Electronically Signed   By: Virgina Norfolk M.D.   On: 07/02/2019 17:42   ECHOCARDIOGRAM COMPLETE  Result Date: 07/03/2019    ECHOCARDIOGRAM REPORT   Patient Name:   LORITA FORINASH Date of Exam: 07/03/2019 Medical Rec #:  [id number]    Height:       62.0 in Accession #:    [id number]   Weight:       255.7 lb Date of Birth:  06-Jun-1950     BSA:          2.12 m Patient Age:    46 years     BP:           113/46 mmHg Patient Gender: F            HR:           93 bpm. Exam Location:  Inpatient Procedure: 2D Echo and Intracardiac Opacification Agent Indications:     Tachycardia [[id number]]  History:         Patient has prior history of Echocardiogram examinations, most                  recent 03/26/2019. COPD; Risk Factors:Former Smoker. Heart                  failure with reduced EF 30-35%. Altered mental status. UTI.  Sonographer:     Darlina Sicilian RDCS Referring Phys:  [place] Diagnosing Phys: Loralie Champagne MD IMPRESSIONS  1. Left ventricular ejection fraction, by estimation, is 25 to 30%. The left ventricle has severely decreased function. The left ventrical demonstrates regional wall motion abnormalities (see scoring diagram/findings for description). The anteroseptal and inferoseptal walls are akinetic, akinetic mid to apical anterior wall, akinetic apical inferior wall, akinetic apex. This is suggestive of large LAD-territory infarction. Left ventricular diastolic parameters are indeterminate (not measured). No LV thrombus noted.  2. Right ventricular systolic function is normal. There is normal pulmonary artery systolic pressure. The estimated right ventricular systolic pressure is 65.7 mmHg.  3. The aortic valve is tricuspid. Aortic valve regurgitation is  mild. No aortic stenosis.  4. Trivial mitral regurgitation.  5. The IVC appears normal in size. FINDINGS  Left Ventricle: Left  ventricular ejection fraction, by estimation, is 25 to 30%. The left ventricle has severely decreased function. The left ventricle demonstrates regional wall motion abnormalities. Definity contrast agent was given IV to delineate the left ventricular endocardial borders. The left ventricular internal cavity size was normal in size. There is no left ventricular hypertrophy. Right Ventricle: The right ventricular size is normal. No increase in right ventricular wall thickness. Right ventricular systolic function is normal. There is normal pulmonary artery systolic pressure. The tricuspid regurgitant velocity is 2.46 m/s, and  with an assumed right atrial pressure of 3 mmHg, the estimated right ventricular systolic pressure is 50.0 mmHg. Left Atrium: Left atrial size was normal in size. Right Atrium: Right atrial size was normal in size. Pericardium: Trivial pericardial effusion is present. Mitral Valve: The mitral valve is normal in structure and function. There is mild calcification of the mitral valve leaflet(s). Mild mitral annular calcification. Trivial mitral valve regurgitation. No evidence of mitral valve stenosis. Tricuspid Valve: The tricuspid valve is normal in structure. Tricuspid valve regurgitation is trivial. Aortic Valve: The aortic valve is tricuspid. Aortic valve regurgitation is mild. Mild aortic valve sclerosis is present, with no evidence of aortic valve stenosis. Pulmonic Valve: The pulmonic valve was normal in structure. Pulmonic valve regurgitation is trivial. Aorta: The aortic root is normal in size and structure. Venous: The inferior vena cava is normal in size with greater than 50% respiratory variability, suggesting right atrial pressure of 3 mmHg. IAS/Shunts: No atrial level shunt detected by color flow Doppler.  LEFT VENTRICLE PLAX 2D LVIDd:         5.20 cm LVIDs:         4.00 cm LV PW:         0.80 cm LV IVS:        1.10 cm LVOT diam:     2.10 cm LV SV Index:   25.60 LVOT Area:     3.46  cm  LV Volumes (MOD) LV area d, A2C:    31.80 cm LV area d, A4C:    35.70 cm LV area s, A2C:    27.70 cm LV area s, A4C:    28.90 cm LV major d, A2C:   8.07 cm LV major d, A4C:   8.11 cm LV major s, A2C:   8.25 cm LV major s, A4C:   7.67 cm LV vol d, MOD A2C: 104.0 ml LV vol d, MOD A4C: 128.0 ml LV vol s, MOD A2C: 79.8 ml LV vol s, MOD A4C: 87.7 ml LV SV MOD A2C:     24.2 ml LV SV MOD A4C:     128.0 ml LV SV MOD BP:      29.4 ml LEFT ATRIUM         Index LA diam:    3.70 cm 1.74 cm/m   AORTA Ao Root diam: 3.30 cm Ao Asc diam:  3.30 cm TRICUSPID VALVE TR Peak grad:   24.2 mmHg TR Vmax:        246.00 cm/s  SHUNTS Systemic Diam: 2.10 cm Loralie Champagne MD Electronically signed by Loralie Champagne MD Signature Date/Time: 07/03/2019/6:30:10 PM    Final (Updated)     Labs: BNP (last 3 results) No results for input(s): BNP in the last 8760 hours. Basic Metabolic Panel: Recent Labs  Lab 07/02/19 1658 07/02/19 2321 07/03/19 0340  NA  140 142 143  K 5.6* 4.6 5.1  CL 99 106 103  CO2 '30 27 29  ' GLUCOSE 92 85 85  BUN 30* 25* 28*  CREATININE 1.60* 1.33* 1.41*  CALCIUM 9.5 8.2* 9.0  MG  --  1.6*  --   PHOS  --  3.4  --    Liver Function Tests: Recent Labs  Lab 07/02/19 1658 07/03/19 0340  AST 44* 37  ALT 27 24  ALKPHOS 64 56  BILITOT 0.6 0.8  PROT 6.6 5.5*  ALBUMIN 3.1* 2.6*   No results for input(s): LIPASE, AMYLASE in the last 168 hours. No results for input(s): AMMONIA in the last 168 hours. CBC: Recent Labs  Lab 07/02/19 1658 07/03/19 0340  WBC 9.5 7.0  NEUTROABS 6.6 4.2  HGB 12.1 11.4*  HCT 40.2 37.1  MCV 107.5* 108.2*  PLT 190 144*   Cardiac Enzymes: No results for input(s): CKTOTAL, CKMB, CKMBINDEX, TROPONINI in the last 168 hours. BNP: Invalid input(s): POCBNP CBG: No results for input(s): GLUCAP in the last 168 hours. D-Dimer No results for input(s): DDIMER in the last 72 hours. Hgb A1c No results for input(s): HGBA1C in the last 72 hours. Lipid Profile No results  for input(s): CHOL, HDL, LDLCALC, TRIG, CHOLHDL, LDLDIRECT in the last 72 hours. Thyroid function studies No results for input(s): TSH, T4TOTAL, T3FREE, THYROIDAB in the last 72 hours.  Invalid input(s): FREET3 Anemia work up No results for input(s): VITAMINB12, FOLATE, FERRITIN, TIBC, IRON, RETICCTPCT in the last 72 hours. Urinalysis    Component Value Date/Time   COLORURINE YELLOW 07/02/2019 1641   APPEARANCEUR CLOUDY (A) 07/02/2019 1641   LABSPEC 1.015 07/02/2019 1641   PHURINE 6.0 07/02/2019 1641   GLUCOSEU NEGATIVE 07/02/2019 1641   HGBUR SMALL (A) 07/02/2019 1641   BILIRUBINUR NEGATIVE 07/02/2019 1641   KETONESUR NEGATIVE 07/02/2019 1641   PROTEINUR NEGATIVE 07/02/2019 1641   UROBILINOGEN 0.2 02/03/2010 2339   NITRITE NEGATIVE 07/02/2019 1641   LEUKOCYTESUR LARGE (A) 07/02/2019 1641   Sepsis Labs Invalid input(s): PROCALCITONIN,  WBC,  LACTICIDVEN Microbiology Recent Results (from the past 240 hour(s))  Blood culture (routine x 2)     Status: Abnormal   Collection Time: 07/02/19  4:58 PM   Specimen: BLOOD  Result Value Ref Range Status   Specimen Description   Final    BLOOD RIGHT ANTECUBITAL Performed at [facility], [place] [address], [place], [place] [address]    Special Requests   Final    BOTTLES DRAWN AEROBIC AND ANAEROBIC Blood Culture results may not be optimal due to an excessive volume of blood received in culture bottles Performed at [facility] [address], [place], [place] [address]    Culture  Setup Time   Final    GRAM POSITIVE COCCI IN PAIRS IN CHAINS IN BOTH AEROBIC AND ANAEROBIC BOTTLES CRITICAL RESULT CALLED TO, READ BACK BY AND VERIFIED WITH: T. GREEN PHARMD, AT 0703 07/03/19 BY Rush Landmark Performed at [facility], [place] [address] [place], [place] [address]    Culture ENTEROCOCCUS FAECALIS (A)  Final   Report Status 07/05/2019 FINAL  Final   Organism ID, Bacteria ENTEROCOCCUS FAECALIS  Final       Susceptibility   Enterococcus faecalis - MIC*    AMPICILLIN <=2 SENSITIVE Sensitive     VANCOMYCIN 1 SENSITIVE Sensitive     GENTAMICIN SYNERGY SENSITIVE Sensitive     * ENTEROCOCCUS FAECALIS  Urine culture     Status: Abnormal  Collection Time: 07/02/19  5:18 PM   Specimen: Urine, Random  Result Value Ref Range Status   Specimen Description   Final    URINE, RANDOM Performed at [facility] [address] [place], [place] [address]    Special Requests   Final    NONE Performed at [facility], [place] [address] [place], [place] [address]    Culture >=100,000 COLONIES/mL YEAST (A)  Final   Report Status 07/03/2019 FINAL  Final  Blood culture (routine x 2)     Status: Abnormal   Collection Time: 07/02/19  5:59 PM   Specimen: BLOOD  Result Value Ref Range Status   Specimen Description   Final    BLOOD LEFT ANTECUBITAL Performed at [facility] [address], [place], [place] [address]    Special Requests   Final    BOTTLES DRAWN AEROBIC AND ANAEROBIC Blood Culture results may not be optimal due to an excessive volume of blood received in culture bottles Performed at [facility] [address], [place], [place] [address]    Culture  Setup Time   Final    GRAM POSITIVE COCCI IN PAIRS AND CHAINS IN BOTH AEROBIC AND ANAEROBIC BOTTLES CRITICAL VALUE NOTED.  VALUE IS CONSISTENT WITH PREVIOUSLY REPORTED AND CALLED VALUE.    Culture (A)  Final    ENTEROCOCCUS FAECALIS SUSCEPTIBILITIES PERFORMED ON PREVIOUS CULTURE WITHIN THE LAST 5 DAYS. Performed at [facility], [place] [address], [place], [place] [address]    Report Status 07/05/2019 FINAL  Final  Blood Culture ID Panel (Reflexed)     Status: Abnormal   Collection Time: 07/02/19  5:59 PM  Result Value Ref Range Status   Enterococcus species DETECTED (A) NOT DETECTED Final    Comment: RESULT CALLED TO, READ BACK BY AND VERIFIED WITH: Karel Jarvis  PharmD 10:55 07/03/19 ([email or web address])    Vancomycin resistance NOT DETECTED NOT DETECTED Final   Listeria monocytogenes NOT DETECTED NOT DETECTED Final   Staphylococcus species NOT DETECTED NOT DETECTED Final   Staphylococcus aureus (BCID) NOT DETECTED NOT DETECTED Final   Streptococcus species NOT DETECTED NOT DETECTED Final   Streptococcus agalactiae NOT DETECTED NOT DETECTED Final   Streptococcus pneumoniae NOT DETECTED NOT DETECTED Final   Streptococcus pyogenes NOT DETECTED NOT DETECTED Final   Acinetobacter baumannii NOT DETECTED NOT DETECTED Final   Enterobacteriaceae species NOT DETECTED NOT DETECTED Final   Enterobacter cloacae complex NOT DETECTED NOT DETECTED Final   Escherichia coli NOT DETECTED NOT DETECTED Final   Klebsiella oxytoca NOT DETECTED NOT DETECTED Final   Klebsiella pneumoniae NOT DETECTED NOT DETECTED Final   Proteus species NOT DETECTED NOT DETECTED Final   Serratia marcescens NOT DETECTED NOT DETECTED Final   Haemophilus influenzae NOT DETECTED NOT DETECTED Final   Neisseria meningitidis NOT DETECTED NOT DETECTED Final   Pseudomonas aeruginosa NOT DETECTED NOT DETECTED Final   Candida albicans NOT DETECTED NOT DETECTED Final   Candida glabrata NOT DETECTED NOT DETECTED Final   Candida krusei NOT DETECTED NOT DETECTED Final   Candida parapsilosis NOT DETECTED NOT DETECTED Final   Candida tropicalis NOT DETECTED NOT DETECTED Final    Comment: Performed at [facility], [address] [place], [place] [address]  SARS CORONAVIRUS 2 (TAT 6-24 HRS) Nasopharyngeal Nasopharyngeal Swab     Status: None   Collection Time: 07/02/19  8:04 PM   Specimen: Nasopharyngeal Swab  Result Value Ref Range Status   SARS  Coronavirus 2 NEGATIVE NEGATIVE Final    Comment: (NOTE) SARS-CoV-2 target nucleic acids are NOT DETECTED. The SARS-CoV-2 RNA is generally detectable in upper and lower respiratory specimens during the acute phase of infection. Negative results do not preclude  SARS-CoV-2 infection, do not rule out co-infections with other pathogens, and should not be used as the sole basis for treatment or other patient management decisions. Negative results must be combined with clinical observations, patient history, and epidemiological information. The expected result is Negative. Fact Sheet for Patients: SugarRoll.be Fact Sheet for Healthcare Providers: https://www.woods-mathews.com/ This test is not yet approved or cleared by the [place] FDA and  has been authorized for detection and/or diagnosis of SARS-CoV-2 by FDA under an Emergency Use Authorization (EUA). This EUA will remain  in effect (meaning this test can be used) for the duration of the COVID-19 declaration under Section 56 4(b)(1) of the Act, 21 U.S.C. section 360bbb-3(b)(1), unless the authorization is terminated or revoked sooner. Performed at [facility], [place] [address] [place], [place] [address]   Culture, blood (routine x 2)     Status: None (Preliminary result)   Collection Time: 07/03/19  1:33 PM   Specimen: BLOOD  Result Value Ref Range Status   Specimen Description   Final    BLOOD RIGHT THUMB Performed at [facility] [address] [place], [place] [address]    Special Requests   Final    BOTTLES DRAWN AEROBIC ONLY Blood Culture results may not be optimal due to an inadequate volume of blood received in culture bottles Performed at [facility] [address] [place], [place] [address]    Culture   Final    NO GROWTH 2 DAYS Performed at [facility] [address] [place], [place] [address]    Report Status PENDING  Incomplete  Culture, blood (routine x 2)     Status: None (Preliminary result)   Collection Time: 07/03/19  1:33 PM   Specimen: BLOOD RIGHT HAND  Result Value Ref Range Status   Specimen Description   Final    BLOOD RIGHT HAND Performed at [facility] [address] [place], [place] [address]    Special Requests   Final    BOTTLES DRAWN AEROBIC ONLY Blood Culture results may not be optimal due to an inadequate volume of blood received in culture bottles Performed at [facility] [address] [place], [place] [address]    Culture   Final    NO GROWTH 2 DAYS Performed at [facility] [address], [place], [place] [address]    Report Status PENDING  Incomplete     Time coordinating discharge: 35 minutes  SIGNED: Antonieta Pert, MD  [facility] 07/06/2019, 10:55 AM  If 7PM-7AM, please contact night-coverage [email or web address]

## 2019-07-04 NOTE — Evaluation (Signed)
Occupational Therapy Evaluation Patient Details Name: Karen Stanley MRN: [id number] DOB: 18-Aug-1950 Today's Date: 07/04/2019    History of Present Illness This 69 year old female was admitted to [facility] for rehab after last hospitalization, last month.  She was admitted to [facility] for AMS.  PMH:  HTN, CAD, COPD, CKD, CHF.     Clinical Impression   Pt was admitted for the above.  SLP requested OT eval for self-feeding secondary to strong intention tremors. Issued weighted spoon; pt is managing with regular cup and she didn't want weighted mug.  Pt is now comfort care.  No further interventions will be performed in acute care.    Follow Up Recommendations  SNF    Equipment Recommendations  None recommended by OT    Recommendations for Other Services       Precautions / Restrictions Precautions Precautions: Fall Precaution Comments: intention tremor Restrictions Weight Bearing Restrictions: No      Mobility Bed Mobility                  Transfers                      Balance                                           ADL either performed or assessed with clinical judgement   ADL Overall ADL's : Needs assistance/impaired Eating/Feeding: Minimal assistance                                     General ADL Comments: pt needs max to total A for most adls due to intension tremors. Only seen at bed level for self-feeding.  Pt is able to manage styrofoam cup with straw with bil hands. She uses strategies of trying to stay closed chain.  She can do some self feeding with plastic ware, but reports that weighted spoon is easier. She continues to try to stay closed chain and slows her speed which helped the tremors.      Vision         Perception     Praxis      Pertinent Vitals/Pain Pain Assessment: No/denies pain     Hand Dominance Right   Extremity/Trunk Assessment Upper Extremity Assessment Upper Extremity Assessment:  (bil intention tremors)           Communication Communication Communication: No difficulties   Cognition                                           General Comments       Exercises     Shoulder Instructions      Home Living Family/patient expects to be discharged to:: Skilled nursing facility                                        Prior Functioning/Environment          Comments: has had assistance for mobility and adls since admission to rehab        OT Problem List:  OT Treatment/Interventions:      OT Goals(Current goals can be found in the care plan section) Acute Rehab OT Goals OT Goal Formulation: All assessment and education complete, DC therapy  OT Frequency:     Barriers to D/C:            Co-evaluation              AM-PAC OT "6 Clicks" Daily Activity     Outcome Measure Help from another person eating meals?: A Little Help from another person taking care of personal grooming?: A Lot Help from another person toileting, which includes using toliet, bedpan, or urinal?: Total Help from another person bathing (including washing, rinsing, drying)?: A Lot Help from another person to put on and taking off regular upper body clothing?: Total Help from another person to put on and taking off regular lower body clothing?: Total 6 Click Score: 10   End of Session    Activity Tolerance: Patient tolerated treatment well Patient left: in bed;with call bell/phone within reach;with family/visitor present  OT Visit Diagnosis: Muscle weakness (generalized) (M62.81)                Time: 8250-5397 OT Time Calculation (min): 21 min Charges:  OT General Charges $OT Visit: 1 Visit OT Evaluation $OT Eval Low Complexity: 1 Low  Eiley Mcginnity S, OTR/L Acute Rehabilitation Services 07/04/2019  Joanne Brander 07/04/2019, 2:00 PM

## 2019-07-04 NOTE — TOC Progression Note (Signed)
Transition of Care [facility]) - Progression Note    Patient Details  Name: Sereen Schaff MRN: [id number] Date of Birth: 18-May-1951  [facility] CM/SW Contact  Jazmene Racz, Meriam Sprague, RN Phone Number: 07/04/2019, 3:20 PM  Clinical Narrative:    This CM was informed by attending MD that [facility]. This CM spoke with pt at bedside who asked to check with [facility] first. This CM contacted [facility] liaison who states that pt doesn't meet their inpt hospice criteria. This CM spoke with pt daughter in room and informed her of the situation. Daughter asked this CM to check if pt would qualify for [facility]. [facility] liaison contacted for [facility] referral.   Living arrangements for the past 2 months: Single Family Home, Skilled Nursing Facility                  Social Determinants of Health (SDOH) Interventions    Readmission Risk Interventions Readmission Risk Prevention Plan 07/03/2019  Transportation Screening Complete  Medication Review (RN CM) Complete  Some recent data might be hidden

## 2019-07-04 NOTE — Progress Notes (Signed)
Administrator, sports received referral for pt to transfer to [facility] for EOL care. Pt's chart currently in review by hospice staff to determine eligibility for [facility].   Writer spoke with pt's daughter to confirm interest. Answered all questions and explained hospice philosophy. Explained that [facility] will inform her if pt is approved, as well as hospital staff.   Please do not hesitate to outreach with any questions and thank you for the referral.   Trena Platt, RN [facility] Liaison [phone]

## 2019-07-04 NOTE — Progress Notes (Signed)
Patient ID: Karen Stanley, female   DOB: 16-May-1951, 69 y.o.   MRN: [id number]         [facility]    Date of Admission:  07/02/2019     I note revision of her treatment plan to comfort measures only.  Ampicillin has been discontinued.  I am in agreement with this plan.  Please call if I can be of further assistance.         Cliffton Asters, MD [facility] [phone] pager   [phone] cell 07/04/2019, 9:10 AM

## 2019-07-05 ENCOUNTER — Other Ambulatory Visit: Payer: Self-pay

## 2019-07-05 LAB — CULTURE, BLOOD (ROUTINE X 2)

## 2019-07-05 MED ORDER — SILVER SULFADIAZINE 1 % EX CREA
TOPICAL_CREAM | Freq: Every day | CUTANEOUS | Status: DC
  Filled 2019-07-05: qty 50

## 2019-07-05 NOTE — Progress Notes (Signed)
PROGRESS NOTE    Karen Stanley  QMG:[id number] DOB: 07-11-1950 DOA: 07/02/2019 PCP: System, Pcp Not In   Brief Narrative: 69 year old withpast medical history significant for heart failure with reduced ejection fraction 30 to 35% in November 2020, COPD, CAD status post CABG 2011, hypertension, paroxysmal A. fib on Pradaxa who presents from a skilled nursing facility with chief complaint of altered mental status and found to have a UTI.Patient was not able to provide history due to altered mental status. Patient has indwelling Foley in place with decreased urine output over the last few days. Of note patient recently admitted on 12/28 1216/09/11/2021 for cellulitis and was discharged to comes in with Foley catheter in place for incontinence. There it seems like she has overall improved, except for on 1/28 when she was noted to have UTI and treated with Rocephin for 5 days completed treatment 2/3. The patient was febrile temperature 101, oriented to person and year otherwise has difficulty answering questions. White blood cell 9.5, potassium 5.6, creatinine 1.6, BUN 30. Baseline creatinine 0.7. Urinalysis with yeast, squamous cell bacteria and leukocytes. CT head without intracranial pathology.  Patient was found to have UTI in the setting of chronic Foley catheter with Enterococcus bacteremia, toxic metabolic encephalopathy-hypoactive delirium due to frequent UTI. AKI/hyperkalemia and chronic systolic CHF.Patient was seen by infectious disease-treated with IV Unasyn-with plan for repeat blood culture and TTE.  If it is felt that there is relatively high risk for endocarditis involving native valves and/or her AICD leads.  Patient family desired to discuss with palliative care and palliative care consulted.  Patient has a MOST form indicating her preference for care.Based on palliative care discussion, DNR, AICD to be turned off, comfort measures only and discontinued all nonessential's meds/IV antibiotics  and referred for hospice IPU. Plan is for discharge for comfort measure.  Subjective: Seen this am. AICD was turned off yesterday.  Patient is alert awake not in acute distress.  Blood pressure in 90s.  Assessment & Plan: Enterococcal bacteremia UTI in the setting of chronic Foley catheter Toxic metabolic encephalopathy/hypoactive delirium Systolic CHF/cardiomyopathy 30-35 % EF 11/20/Coronary atherosclerosis HLD Essential hypertension COPD  PAF  Macrocytic anemia Acute kidney injury Morbid obesity w/ BMI 46. Debility/physical deconditioning AICD in place to be turned off prior to discharge Unstageable pressure ulcer on bilateral buttock present on admission. Multiple comorbidities/debility/deconditioning  At this time after palliative care meeting family opted for comfort measures. IV Antibiotics were discontinued 07/03/19. Patient is currently DNR and on comfort measures and on minimal po meds to help with comfort.Waiting for transfer to hospice versus skilled nursing facility. CM advised PT eval.  Body mass index is 46.77 kg/m.    DVT prophylaxis: SCD Code Status: DNR  Family Communication: plan of care discussed with patient at bedside.  I called and updated the patient's daughter and is aware patient is on comfort measures not on antibiotics and that case manager is looking for places for further disposition. Disposition Plan:Anticipated d/c to: HOSPICE Barriers to d/c or conditions that needs to be met prior to d/c: Awaiting for placement and hospice dispo.  Consultants: Palliative care, infectious disease.   Procedures:see note Microbiology:see note Antimicrobials: Anti-infectives (From admission, onward)   Start     Dose/Rate Route Frequency Ordered Stop   07/03/19 1200  ampicillin (OMNIPEN) 2 g in sodium chloride 0.9 % 100 mL IVPB  Status:  Discontinued     2 g 300 mL/hr over 20 Minutes Intravenous Every 4 hours 07/03/19 1114 07/03/19  1832   07/03/19 0200   Ampicillin-Sulbactam (UNASYN) 3 g in sodium chloride 0.9 % 100 mL IVPB  Status:  Discontinued     3 g 200 mL/hr over 30 Minutes Intravenous Every 6 hours 07/02/19 2051 07/03/19 1114   07/02/19 1815  Ampicillin-Sulbactam (UNASYN) 3 g in sodium chloride 0.9 % 100 mL IVPB     3 g 200 mL/hr over 30 Minutes Intravenous  Once 07/02/19 1815 07/02/19 2013      Medications: Scheduled Meds: . allopurinol  300 mg Oral Daily  . carvedilol  3.125 mg Oral QHS  . Chlorhexidine Gluconate Cloth  6 each Topical Daily  . mometasone-formoterol  2 puff Inhalation BID  . pantoprazole  40 mg Oral Daily  . silver sulfADIAZINE   Topical Daily  . sodium chloride flush  3 mL Intravenous Q12H   Continuous Infusions:  Objective: Vitals:   07/04/19 2140 07/04/19 2204 07/05/19 0547 07/05/19 0547  BP: 95/75  95/75 (!) 90/50  Pulse:  85 85 84  Resp:   20 17  Temp:  (!) 97.5 F (36.4 C) (!) 97.5 F (36.4 C) (!) 97.4 F (36.3 C)  TempSrc:  Oral Oral Oral  SpO2: 95% 95%  98%  Weight:   116 kg   Height:   5' 2"  (1.575 m)     Intake/Output Summary (Last 24 hours) at 07/05/2019 1257 Last data filed at 07/05/2019 1230 Gross per 24 hour  Intake 640 ml  Output 900 ml  Net -260 ml   Filed Weights   07/03/19 1150 07/05/19 0547  Weight: 116 kg 116 kg   Weight change: 0 kg  Body mass index is 46.77 kg/m.  Intake/Output from previous day: 02/09 0701 - 02/10 0700 In: 680 [P.O.:680] Out: 600 [Urine:600] Intake/Output this shift: Total I/O In: 440 [P.O.:440] Out: 300 [Urine:300]  Examination:  General exam: Alert awake, obese, frail.   HEENT:Oral mucosa moist, Ear/Nose WNL grossly, dentition normal. Respiratory system: Clear bilaterally, no wheezing or crackles.No use of accessory muscle Cardiovascular system: S1 & S2 +, No JVD,. Gastrointestinal system: Abdomen soft, NT,ND, BS+ Nervous System:Alert, awake, moving extremities and grossly nonfocal Extremities: No edema, distal peripheral pulses  palpable.  Skin: No rashes,no icterus. MSK: Normal muscle bulk,tone, power  Data Reviewed: I have personally reviewed following labs and imaging studies  CBC: Recent Labs  Lab 07/02/19 1658 07/03/19 0340  WBC 9.5 7.0  NEUTROABS 6.6 4.2  HGB 12.1 11.4*  HCT 40.2 37.1  MCV 107.5* 108.2*  PLT 190 244*   Basic Metabolic Panel: Recent Labs  Lab 07/02/19 1658 07/02/19 2321 07/03/19 0340  NA 140 142 143  K 5.6* 4.6 5.1  CL 99 106 103  CO2 30 27 29   GLUCOSE 92 85 85  BUN 30* 25* 28*  CREATININE 1.60* 1.33* 1.41*  CALCIUM 9.5 8.2* 9.0  MG  --  1.6*  --   PHOS  --  3.4  --    GFR: Estimated Creatinine Clearance: 45.5 mL/min (A) (by C-G formula based on SCr of 1.41 mg/dL (H)). Liver Function Tests: Recent Labs  Lab 07/02/19 1658 07/03/19 0340  AST 44* 37  ALT 27 24  ALKPHOS 64 56  BILITOT 0.6 0.8  PROT 6.6 5.5*  ALBUMIN 3.1* 2.6*   No results for input(s): LIPASE, AMYLASE in the last 168 hours. No results for input(s): AMMONIA in the last 168 hours. Coagulation Profile: No results for input(s): INR, PROTIME in the last 168 hours. Cardiac Enzymes: No results for  input(s): CKTOTAL, CKMB, CKMBINDEX, TROPONINI in the last 168 hours. BNP (last 3 results) No results for input(s): PROBNP in the last 8760 hours. HbA1C: No results for input(s): HGBA1C in the last 72 hours. CBG: No results for input(s): GLUCAP in the last 168 hours. Lipid Profile: No results for input(s): CHOL, HDL, LDLCALC, TRIG, CHOLHDL, LDLDIRECT in the last 72 hours. Thyroid Function Tests: No results for input(s): TSH, T4TOTAL, FREET4, T3FREE, THYROIDAB in the last 72 hours. Anemia Panel: No results for input(s): VITAMINB12, FOLATE, FERRITIN, TIBC, IRON, RETICCTPCT in the last 72 hours. Sepsis Labs: Recent Labs  Lab 07/02/19 1658 07/02/19 1841  LATICACIDVEN 1.7 1.3    Recent Results (from the past 240 hour(s))  Blood culture (routine x 2)     Status: Abnormal   Collection Time: 07/02/19   4:58 PM   Specimen: BLOOD  Result Value Ref Range Status   Specimen Description   Final    BLOOD RIGHT ANTECUBITAL Performed at [facility] [address] [place], [place] [address]    Special Requests   Final    BOTTLES DRAWN AEROBIC AND ANAEROBIC Blood Culture results may not be optimal due to an excessive volume of blood received in culture bottles Performed at [facility] [address], [place], [place] [address]    Culture  Setup Time   Final    GRAM POSITIVE COCCI IN PAIRS IN CHAINS IN BOTH AEROBIC AND ANAEROBIC BOTTLES CRITICAL RESULT CALLED TO, READ BACK BY AND VERIFIED WITH: T. GREEN PHARMD, AT 0703 07/03/19 BY Rush Landmark Performed at [facility], [place] [address] [place], [place] [address]    Culture ENTEROCOCCUS FAECALIS (A)  Final   Report Status 07/05/2019 FINAL  Final   Organism ID, Bacteria ENTEROCOCCUS FAECALIS  Final      Susceptibility   Enterococcus faecalis - MIC*    AMPICILLIN <=2 SENSITIVE Sensitive     VANCOMYCIN 1 SENSITIVE Sensitive     GENTAMICIN SYNERGY SENSITIVE Sensitive     * ENTEROCOCCUS FAECALIS  Urine culture     Status: Abnormal   Collection Time: 07/02/19  5:18 PM   Specimen: Urine, Random  Result Value Ref Range Status   Specimen Description   Final    URINE, RANDOM Performed at [facility] [address], [place], [place] [address]    Special Requests   Final    NONE Performed at [facility], [place] [address] [place], [place] [address]    Culture >=100,000 COLONIES/mL YEAST (A)  Final   Report Status 07/03/2019 FINAL  Final  Blood culture (routine x 2)     Status: Abnormal   Collection Time: 07/02/19  5:59 PM   Specimen: BLOOD  Result Value Ref Range Status   Specimen Description   Final    BLOOD LEFT ANTECUBITAL Performed at [facility] [address] [place], [place] [address]    Special Requests   Final    BOTTLES DRAWN  AEROBIC AND ANAEROBIC Blood Culture results may not be optimal due to an excessive volume of blood received in culture bottles Performed at [facility] [address] [place], [place] [address]    Culture  Setup Time   Final    GRAM POSITIVE COCCI IN PAIRS AND CHAINS IN BOTH AEROBIC AND ANAEROBIC BOTTLES CRITICAL VALUE NOTED.  VALUE IS CONSISTENT WITH PREVIOUSLY REPORTED AND CALLED VALUE.    Culture (A)  Final  ENTEROCOCCUS FAECALIS SUSCEPTIBILITIES PERFORMED ON PREVIOUS CULTURE WITHIN THE LAST 5 DAYS. Performed at [facility], [place] [address] [place], [place] [address]    Report Status 07/05/2019 FINAL  Final  Blood Culture ID Panel (Reflexed)     Status: Abnormal   Collection Time: 07/02/19  5:59 PM  Result Value Ref Range Status   Enterococcus species DETECTED (A) NOT DETECTED Final    Comment: RESULT CALLED TO, READ BACK BY AND VERIFIED WITH: Karel Jarvis PharmD 10:55 07/03/19 ([email or web address])    Vancomycin resistance NOT DETECTED NOT DETECTED Final   Listeria monocytogenes NOT DETECTED NOT DETECTED Final   Staphylococcus species NOT DETECTED NOT DETECTED Final   Staphylococcus aureus (BCID) NOT DETECTED NOT DETECTED Final   Streptococcus species NOT DETECTED NOT DETECTED Final   Streptococcus agalactiae NOT DETECTED NOT DETECTED Final   Streptococcus pneumoniae NOT DETECTED NOT DETECTED Final   Streptococcus pyogenes NOT DETECTED NOT DETECTED Final   Acinetobacter baumannii NOT DETECTED NOT DETECTED Final   Enterobacteriaceae species NOT DETECTED NOT DETECTED Final   Enterobacter cloacae complex NOT DETECTED NOT DETECTED Final   Escherichia coli NOT DETECTED NOT DETECTED Final   Klebsiella oxytoca NOT DETECTED NOT DETECTED Final   Klebsiella pneumoniae NOT DETECTED NOT DETECTED Final   Proteus species NOT DETECTED NOT DETECTED Final   Serratia marcescens NOT DETECTED NOT DETECTED Final   Haemophilus influenzae NOT DETECTED NOT DETECTED Final   Neisseria  meningitidis NOT DETECTED NOT DETECTED Final   Pseudomonas aeruginosa NOT DETECTED NOT DETECTED Final   Candida albicans NOT DETECTED NOT DETECTED Final   Candida glabrata NOT DETECTED NOT DETECTED Final   Candida krusei NOT DETECTED NOT DETECTED Final   Candida parapsilosis NOT DETECTED NOT DETECTED Final   Candida tropicalis NOT DETECTED NOT DETECTED Final    Comment: Performed at [facility], [address], [place], [place] [address]  SARS CORONAVIRUS 2 (TAT 6-24 HRS) Nasopharyngeal Nasopharyngeal Swab     Status: None   Collection Time: 07/02/19  8:04 PM   Specimen: Nasopharyngeal Swab  Result Value Ref Range Status   SARS Coronavirus 2 NEGATIVE NEGATIVE Final    Comment: (NOTE) SARS-CoV-2 target nucleic acids are NOT DETECTED. The SARS-CoV-2 RNA is generally detectable in upper and lower respiratory specimens during the acute phase of infection. Negative results do not preclude SARS-CoV-2 infection, do not rule out co-infections with other pathogens, and should not be used as the sole basis for treatment or other patient management decisions. Negative results must be combined with clinical observations, patient history, and epidemiological information. The expected result is Negative. Fact Sheet for Patients: SugarRoll.be Fact Sheet for Healthcare Providers: https://www.woods-mathews.com/ This test is not yet approved or cleared by the [place] FDA and  has been authorized for detection and/or diagnosis of SARS-CoV-2 by FDA under an Emergency Use Authorization (EUA). This EUA will remain  in effect (meaning this test can be used) for the duration of the COVID-19 declaration under Section 56 4(b)(1) of the Act, 21 U.S.C. section 360bbb-3(b)(1), unless the authorization is terminated or revoked sooner. Performed at [facility], [place] [address], [place], [place] [address]   Culture, blood (routine x 2)     Status: None  (Preliminary result)   Collection Time: 07/03/19  1:33 PM   Specimen: BLOOD  Result Value Ref Range Status   Specimen Description   Final    BLOOD RIGHT THUMB Performed at [facility] [address] [place],  [place] [address]    Special Requests   Final    BOTTLES DRAWN AEROBIC ONLY Blood Culture results may not be optimal due to an inadequate volume of blood received in culture bottles Performed at [facility] [address] [place], [place] [address]    Culture   Final    NO GROWTH 2 DAYS Performed at [facility] [address], [place], [place] [address]    Report Status PENDING  Incomplete  Culture, blood (routine x 2)     Status: None (Preliminary result)   Collection Time: 07/03/19  1:33 PM   Specimen: BLOOD RIGHT HAND  Result Value Ref Range Status   Specimen Description   Final    BLOOD RIGHT HAND Performed at [facility] [address] [place], [place] [address]    Special Requests   Final    BOTTLES DRAWN AEROBIC ONLY Blood Culture results may not be optimal due to an inadequate volume of blood received in culture bottles Performed at [facility] [address], [place], [place] [address]    Culture   Final    NO GROWTH 2 DAYS Performed at [facility] [address] [place] [place], [place] [address]    Report Status PENDING  Incomplete      Radiology Studies: ECHOCARDIOGRAM COMPLETE  Result Date: 07/03/2019    ECHOCARDIOGRAM REPORT   Patient Name:   LITSY EPTING Date of Exam: 07/03/2019 Medical Rec #:  [id number]    Height:       62.0 in Accession #:    [id number]   Weight:       255.7 lb Date of Birth:  Dec 02, 1950     BSA:          2.12 m Patient Age:    38 years     BP:           113/46 mmHg Patient Gender: F            HR:           93 bpm. Exam Location:  Inpatient Procedure: 2D Echo and Intracardiac Opacification Agent Indications:     Tachycardia [[id number]]  History:          Patient has prior history of Echocardiogram examinations, most                  recent 03/26/2019. COPD; Risk Factors:Former Smoker. Heart                  failure with reduced EF 30-35%. Altered mental status. UTI.  Sonographer:     Darlina Sicilian RDCS Referring Phys:  [place] Diagnosing Phys: Loralie Champagne MD IMPRESSIONS  1. Left ventricular ejection fraction, by estimation, is 25 to 30%. The left ventricle has severely decreased function. The left ventrical demonstrates regional wall motion abnormalities (see scoring diagram/findings for description). The anteroseptal and inferoseptal walls are akinetic, akinetic mid to apical anterior wall, akinetic apical inferior wall, akinetic apex. This is suggestive of large LAD-territory infarction. Left ventricular diastolic parameters are indeterminate (not measured). No LV thrombus noted.  2. Right ventricular systolic function is normal. There is normal pulmonary artery systolic pressure. The estimated right ventricular systolic pressure is 68.1 mmHg.  3. The aortic valve is tricuspid. Aortic valve regurgitation is mild. No aortic stenosis.  4. Trivial mitral regurgitation.  5. The IVC appears normal in size. FINDINGS  Left Ventricle: Left ventricular ejection fraction, by estimation,  is 25 to 30%. The left ventricle has severely decreased function. The left ventricle demonstrates regional wall motion abnormalities. Definity contrast agent was given IV to delineate the left ventricular endocardial borders. The left ventricular internal cavity size was normal in size. There is no left ventricular hypertrophy. Right Ventricle: The right ventricular size is normal. No increase in right ventricular wall thickness. Right ventricular systolic function is normal. There is normal pulmonary artery systolic pressure. The tricuspid regurgitant velocity is 2.46 m/s, and  with an assumed right atrial pressure of 3 mmHg, the estimated right ventricular systolic pressure  is 03.0 mmHg. Left Atrium: Left atrial size was normal in size. Right Atrium: Right atrial size was normal in size. Pericardium: Trivial pericardial effusion is present. Mitral Valve: The mitral valve is normal in structure and function. There is mild calcification of the mitral valve leaflet(s). Mild mitral annular calcification. Trivial mitral valve regurgitation. No evidence of mitral valve stenosis. Tricuspid Valve: The tricuspid valve is normal in structure. Tricuspid valve regurgitation is trivial. Aortic Valve: The aortic valve is tricuspid. Aortic valve regurgitation is mild. Mild aortic valve sclerosis is present, with no evidence of aortic valve stenosis. Pulmonic Valve: The pulmonic valve was normal in structure. Pulmonic valve regurgitation is trivial. Aorta: The aortic root is normal in size and structure. Venous: The inferior vena cava is normal in size with greater than 50% respiratory variability, suggesting right atrial pressure of 3 mmHg. IAS/Shunts: No atrial level shunt detected by color flow Doppler.  LEFT VENTRICLE PLAX 2D LVIDd:         5.20 cm LVIDs:         4.00 cm LV PW:         0.80 cm LV IVS:        1.10 cm LVOT diam:     2.10 cm LV SV Index:   25.60 LVOT Area:     3.46 cm  LV Volumes (MOD) LV area d, A2C:    31.80 cm LV area d, A4C:    35.70 cm LV area s, A2C:    27.70 cm LV area s, A4C:    28.90 cm LV major d, A2C:   8.07 cm LV major d, A4C:   8.11 cm LV major s, A2C:   8.25 cm LV major s, A4C:   7.67 cm LV vol d, MOD A2C: 104.0 ml LV vol d, MOD A4C: 128.0 ml LV vol s, MOD A2C: 79.8 ml LV vol s, MOD A4C: 87.7 ml LV SV MOD A2C:     24.2 ml LV SV MOD A4C:     128.0 ml LV SV MOD BP:      29.4 ml LEFT ATRIUM         Index LA diam:    3.70 cm 1.74 cm/m   AORTA Ao Root diam: 3.30 cm Ao Asc diam:  3.30 cm TRICUSPID VALVE TR Peak grad:   24.2 mmHg TR Vmax:        246.00 cm/s  SHUNTS Systemic Diam: 2.10 cm Loralie Champagne MD Electronically signed by Loralie Champagne MD Signature Date/Time:  07/03/2019/6:30:10 PM    Final (Updated)      LOS: 3 days   Time spent: More than 50% of that time was spent in counseling and/or coordination of care.  Antonieta Pert, MD [facility]  07/05/2019, 12:57 PM

## 2019-07-05 NOTE — Consult Note (Signed)
WOC Nurse Consult Note: Reason for Consult:Bilateral buttocks and sacral unstageable pressure injuries, present on admission.  Skin breakdown from excessive moisture noted on last admission.  Wounds have progressed to full thickness, unstageable pressure injuries, now including bony prominences of ischium.  Wound type:pressure and moisture Pressure Injury POA: Yes Measurement: 5 cm x 12 cm (spreads across bilateral gluteal folds and sacrum)  Wound bed: maroon discoloration with peeling epithelium  Drainage (amount, consistency, odor) minimal serosanguinous  No odor.  Periwound: nonblanchable erythema Dressing procedure/placement/frequency: Cleanse sacral and buttocks wounds with NS and pat dry.  Apply silvadene cream to wound bed.  Cover with ABD pads and tape. Change daily.  Turn and reposition every two hours.  Mattress with low air loss feature.  Will not follow at this time.  Please re-consult if needed.  [place]le [place] MSN, RN, FNP-BC CWON Wound, Ostomy, Continence Nurse Pager [phone]

## 2019-07-05 NOTE — TOC Progression Note (Signed)
Transition of Care [facility]) - Progression Note    Patient Details  Name: Karen Stanley MRN: [id number] Date of Birth: 1951-03-27  [facility] of [facility]) CM/SW Contact  Benson Porcaro, Meriam Sprague, RN Phone Number: 07/05/2019, 3:03 PM  Clinical Narrative:     Pt was evaluated by both [facility] and [facility]. She is not eligible for either inpatient hospice facility. This CM contacted [facility], where pt was prior to admission. Per [facility] they were in the process of transferring pt to long term care at [facility]. [place] liaison to follow up with [facility] administrator to see if pt can go there directly from hospital. [facility] will continue to follow.  Expected Discharge Plan: Skilled Nursing Facility Barriers to Discharge: Continued Medical Work up  Expected Discharge Plan and Services Expected Discharge Plan: Skilled Nursing Facility In-house Referral: Clinical Social Work     Living arrangements for the past 2 months: Single Family Home, Skilled Nursing Facility                    Social Determinants of Health (SDOH) Interventions    Readmission Risk Interventions Readmission Risk Prevention Plan 07/03/2019  Transportation Screening Complete  Medication Review (RN CM) Complete  Some recent data might be hidden

## 2019-07-05 NOTE — Progress Notes (Addendum)
PT Cancellation Note  Patient Details Name: Karen Stanley MRN: [id number] DOB: [birth year]   Cancelled Treatment:    Reason Eval/Treat Not Completed: PT screened, no needs identified, will sign off Per chart review, pt is now comfort care.  Per TOC notes: pt was in process of transferring to long term care.  Pt does not appear appropriate for acute PT needs at this time.  If needs change, please reorder.   Nikka Hakimian,KATHrine E 07/05/2019, 3:54 PM Thomasene Mohair PT, DPT Acute Rehabilitation Services Office: [phone]

## 2019-07-05 NOTE — Care Management Important Message (Signed)
Important Message  Patient Details IM Letter given to Sandford Craze RN Case Manager to present to the Patient Name: Karen Stanley MRN: [id number] Date of Birth: 1950/12/09   Medicare Important Message Given:  Yes     Caren Macadam 07/05/2019, 11:16 AM

## 2019-07-06 MED ORDER — SILVER SULFADIAZINE 1 % EX CREA: TOPICAL_CREAM | Freq: Every day | CUTANEOUS | 0 refills | Status: AC

## 2019-07-06 MED ORDER — POLYETHYLENE GLYCOL 3350 17 G PO PACK: 17.0000 g | PACK | Freq: Every day | ORAL | 0 refills | Status: AC | PRN

## 2019-07-06 NOTE — Progress Notes (Signed)
Report given to nurse Jimmy Picket forrent LTC.

## 2019-07-06 NOTE — TOC Transition Note (Signed)
Transition of Care [facility]) - CM/SW Discharge Note   Patient Details  Name: Karen Stanley MRN: [id number] Date of Birth: 1951-04-28  Transition of Care [facility] CM/SW Contact:  Bartholome Bill, RN Phone Number: 07/06/2019, 12:42 PM   Clinical Narrative:    This CM was contacted by Damian Leavell (liaison from [facility]). Damian Leavell states that they will have a LTC bed for pt today. This CM spoke with both pt and daughter who accept bed at [facility]. All requested information faxed to [phone]. RN to call report to [phone]. Pt to go to room 110 on C wing. [facility] contacted for transport. Most form to be sent with pt.   Final next level of care: Long Term Nursing Home Barriers to Discharge: No Barriers Identified    Readmission Risk Interventions Readmission Risk Prevention Plan 07/03/2019  Transportation Screening Complete  Medication Review (RN CM) Complete  Some recent data might be hidden

## 2019-07-08 LAB — CULTURE, BLOOD (ROUTINE X 2)
Culture: NO GROWTH
Culture: NO GROWTH

## 2019-08-24 DEATH — deceased

## 2021-03-30 IMAGING — DX DG CHEST 1V PORT
1 series · 1 of 1 positions shown · non-contrast
Comparison: Chest radiograph 05/09/2019

CLINICAL DATA: Weakness.

EXAM:
PORTABLE CHEST 1 VIEW

[chest ap]
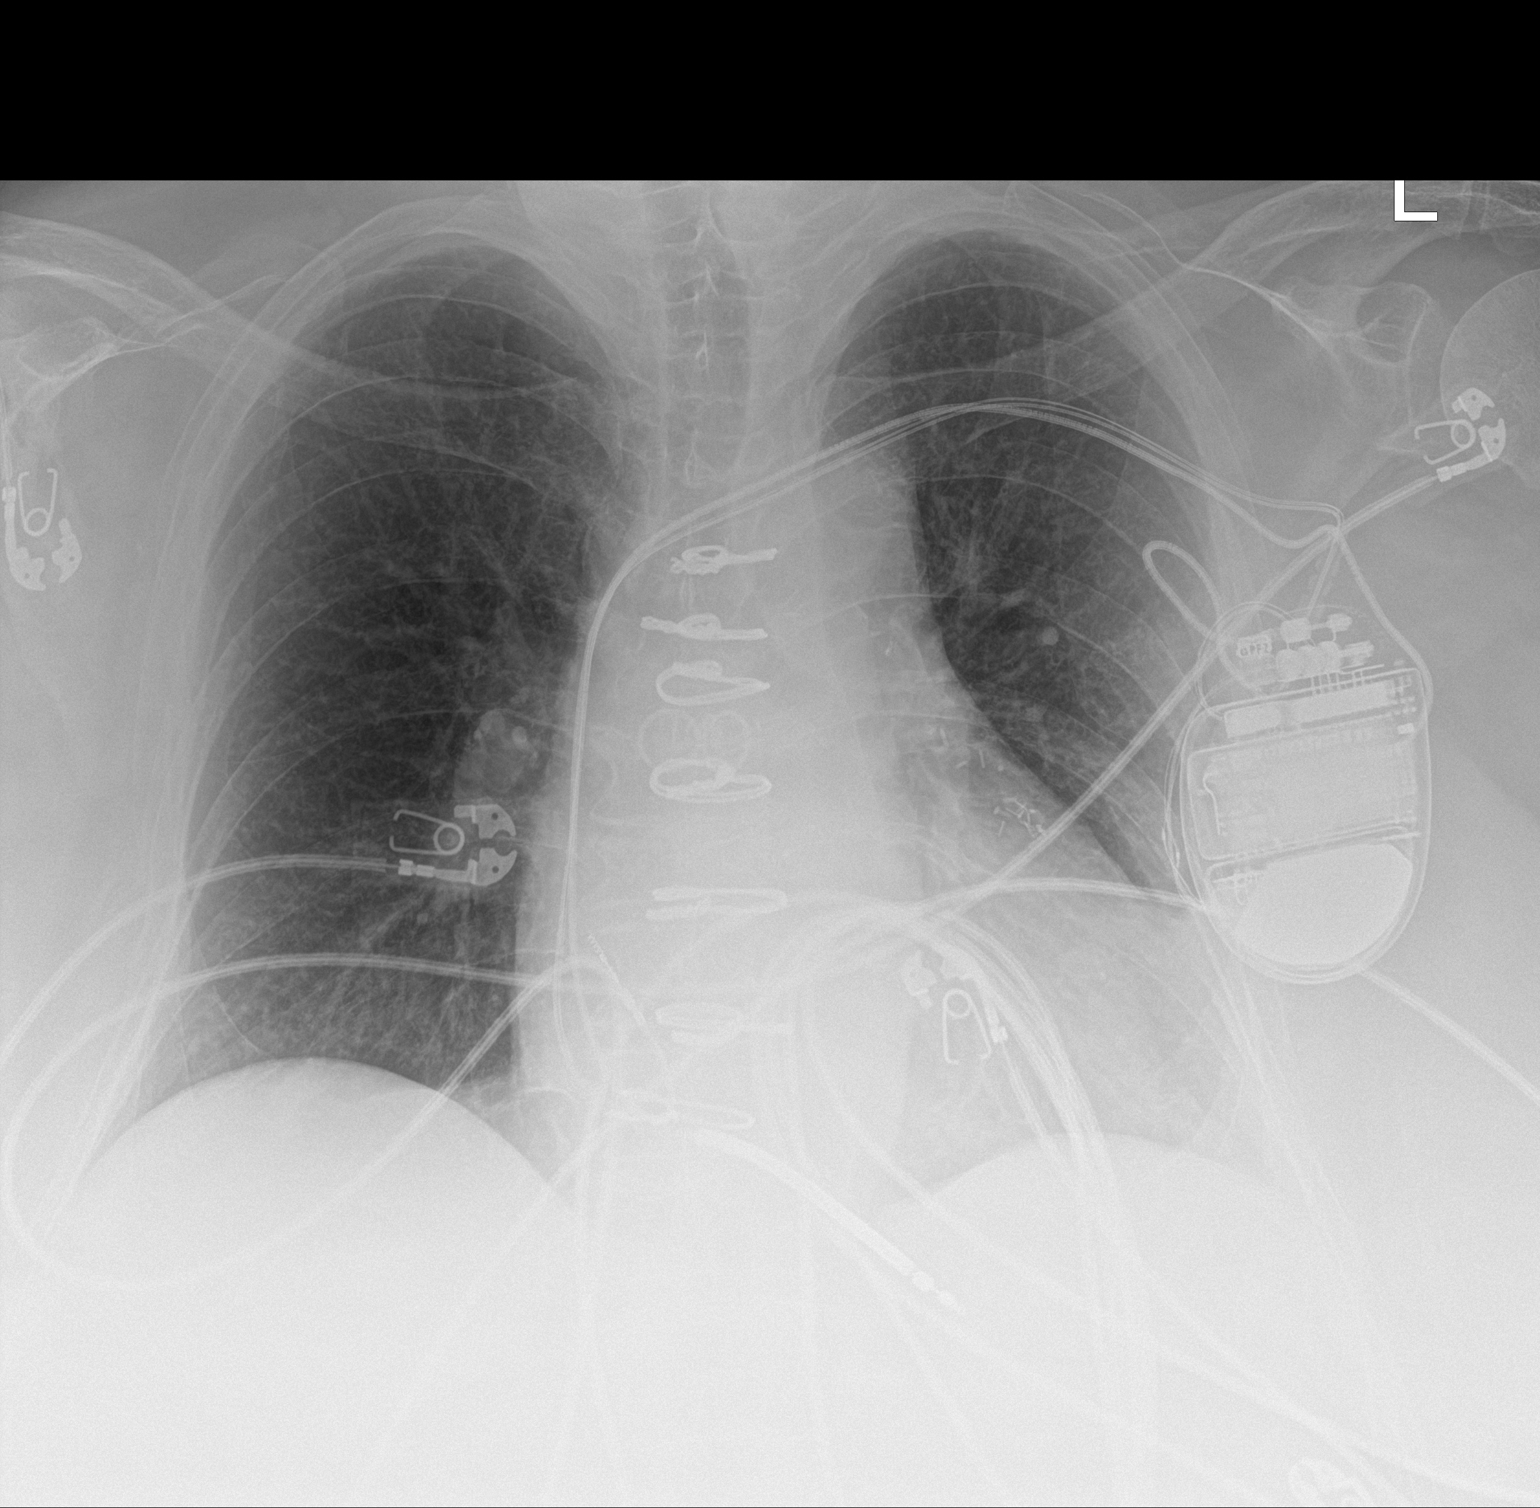

[1 of 1 positions shown; findings below may reference images not displayed]

FINDINGS: Redemonstrated left chest AICD device. Mild cardiomegaly is
unchanged. Prior CABG. No focal consolidation within the lungs. No
evidence of pleural effusion or pneumothorax. No acute bony
abnormality.
IMPRESSION: No evidence of acute cardiopulmonary abnormality.

Stable, mild cardiomegaly.
